# Patient Record
Sex: Male | Born: 1990 | Hispanic: No | Marital: Married | Smoking: Never smoker
Health system: Southern US, Community
[De-identification: ages and names within clinical notes are randomized; demographics above are authoritative.]

## PROBLEM LIST (undated history)

## (undated) DIAGNOSIS — K602 Anal fissure, unspecified: Secondary | ICD-10-CM

## (undated) DIAGNOSIS — A048 Other specified bacterial intestinal infections: Secondary | ICD-10-CM

## (undated) HISTORY — DX: Other specified bacterial intestinal infections: A04.8

## (undated) HISTORY — DX: Anal fissure, unspecified: K60.2

---

## 2019-12-21 HISTORY — PX: LACERATION REPAIR: SHX5168

## 2020-12-02 ENCOUNTER — Other Ambulatory Visit: Payer: Self-pay

## 2020-12-02 ENCOUNTER — Encounter (HOSPITAL_COMMUNITY): Payer: Self-pay

## 2020-12-02 ENCOUNTER — Ambulatory Visit (HOSPITAL_COMMUNITY)
Admission: EM | Admit: 2020-12-02 | Discharge: 2020-12-02 | Disposition: A | Discharge status: Home / Self Care | Payer: Medicaid Other | Attending: Family Medicine | Admitting: Family Medicine

## 2020-12-02 DIAGNOSIS — R52 Pain, unspecified: Secondary | ICD-10-CM | POA: Insufficient documentation

## 2020-12-02 DIAGNOSIS — R509 Fever, unspecified: Secondary | ICD-10-CM | POA: Diagnosis not present

## 2020-12-02 DIAGNOSIS — Z20822 Contact with and (suspected) exposure to covid-19: Secondary | ICD-10-CM | POA: Diagnosis not present

## 2020-12-02 DIAGNOSIS — J111 Influenza due to unidentified influenza virus with other respiratory manifestations: Secondary | ICD-10-CM | POA: Diagnosis not present

## 2020-12-02 LAB — RESP PANEL BY RT-PCR (FLU A&B, COVID) ARPGX2
Influenza A by PCR: NEGATIVE
Influenza B by PCR: NEGATIVE
SARS Coronavirus 2 by RT PCR: NEGATIVE

## 2020-12-02 NOTE — ED Provider Notes (Signed)
[place]    CSN: [id number] Arrival date & time: 12/02/20  1029      History   Chief Complaint Chief Complaint  Patient presents with   Back Pain   Sore Throat   Generalized Body Aches   Nausea    HPI Tyre Beaver is a 29 y.o. male.   Here today with 2 day history of sore throat, generalized body aches, chills, sweats, fever, and mild abdominal pain. Denies cough, CP, SOB, congestion, N/V/D. So far taking tylenol with temporary relief of sxs. Denies known medical problems, sick contacts, recent travel. UTD on vaccines.      History reviewed. No pertinent past medical history.  There are no problems to display for this patient.   History reviewed. No pertinent surgical history.     Home Medications    Prior to Admission medications   Not on File    Family History History reviewed. No pertinent family history.  Social History Social History   Tobacco Use   Smoking status: Never Smoker   Smokeless tobacco: Never Used  Scientific laboratory technician Use: Never used  Substance Use Topics   Alcohol use: Never   Drug use: Never     Allergies   Patient has no known allergies.   Review of Systems Review of Systems PER HPI   Physical Exam Triage Vital Signs ED Triage Vitals  Enc Vitals Group     BP 12/02/20 1150 119/78     Pulse Rate 12/02/20 1150 92     Resp 12/02/20 1150 18     Temp 12/02/20 1150 98.5 F (36.9 C)     Temp Source 12/02/20 1150 Oral     SpO2 12/02/20 1150 97 %     Weight --      Height --      Head Circumference --      Peak Flow --      Pain Score 12/02/20 1144 6     Pain Loc --      Pain Edu? --      Excl. in [place]? --    No data found.  Updated Vital Signs BP 119/78 (BP Location: Left Arm)    Pulse 92    Temp 98.5 F (36.9 C) (Oral)    Resp 18    SpO2 97%   Visual Acuity Right Eye Distance:   Left Eye Distance:   Bilateral Distance:    Right Eye Near:   Left Eye Near:    Bilateral  Near:     Physical Exam Vitals and nursing note reviewed.  Constitutional:      Appearance: Normal appearance.  HENT:     Head: Atraumatic.     Right Ear: Tympanic membrane normal.     Left Ear: Tympanic membrane normal.     Nose: Nose normal.     Mouth/Throat:     Mouth: Mucous membranes are moist.     Pharynx: Oropharynx is clear. Posterior oropharyngeal erythema present. No oropharyngeal exudate.  Eyes:     Extraocular Movements: Extraocular movements intact.     Conjunctiva/sclera: Conjunctivae normal.  Cardiovascular:     Rate and Rhythm: Normal rate and regular rhythm.  Pulmonary:     Effort: Pulmonary effort is normal.     Breath sounds: Normal breath sounds.  Abdominal:     General: Bowel sounds are normal. There is no distension.     Palpations: Abdomen is soft.     Tenderness: There  is no abdominal tenderness. There is no right CVA tenderness, left CVA tenderness or guarding.  Musculoskeletal:        General: Normal range of motion.     Cervical back: Normal range of motion and neck supple.  Skin:    General: Skin is warm and dry.  Neurological:     General: No focal deficit present.     Mental Status: He is oriented to person, place, and time.  Psychiatric:        Mood and Affect: Mood normal.        Thought Content: Thought content normal.        Judgment: Judgment normal.      UC Treatments / Results  Labs (all labs ordered are listed, but only abnormal results are displayed) Labs Reviewed  RESP PANEL BY RT-PCR (FLU A&B, COVID) [id number]    EKG   Radiology No results found.  Procedures Procedures (including critical care time)  Medications Ordered in UC Medications - No data to display  Initial Impression / Assessment and Plan / UC Course  I have reviewed the triage vital signs and the nursing notes.  Pertinent labs & imaging results that were available during my care of the patient were reviewed by me and considered in my medical decision  making (see chart for details).     Resp panel pending, vitals and exam reassuring and per pt sxs improved today from yesterday. Isolate, work note given, supportive OTC medications and home care reviewed. Return if sxs worsening.   Final Clinical Impressions(s) / UC Diagnoses   Final diagnoses:  Influenza-like illness  Generalized body aches  Fever, unspecified   Discharge Instructions   None    ED Prescriptions    None     PDMP not reviewed this encounter.   Volney American, [place] 12/02/20 1624

## 2020-12-02 NOTE — ED Triage Notes (Signed)
Pt presents with weakness, lower back pain, nausea, sore throat, generalized bodyaches x 2 days. Pt states he had a fever last night along with mid abdominal pain. Pt states he has taken tylenol and ibuprofen. Pt states he did not take medicine this morning. Pt denies SO and chest pain. Pt c/o left arm pain, he states he had arm surgery 3-4 month ago.

## 2021-03-16 ENCOUNTER — Telehealth: Payer: Self-pay | Admitting: Pediatric Intensive Care

## 2021-03-16 ENCOUNTER — Ambulatory Visit (INDEPENDENT_AMBULATORY_CARE_PROVIDER_SITE_OTHER): Payer: Medicaid Other | Admitting: Internal Medicine

## 2021-03-16 ENCOUNTER — Encounter: Payer: Self-pay | Admitting: Internal Medicine

## 2021-03-16 VITALS — BP 120/78 | HR 66 | Temp 98.5°F | Ht 64.96 in | Wt 155.8 lb

## 2021-03-16 DIAGNOSIS — F411 Generalized anxiety disorder: Secondary | ICD-10-CM | POA: Diagnosis present

## 2021-03-16 DIAGNOSIS — R1013 Epigastric pain: Secondary | ICD-10-CM | POA: Diagnosis not present

## 2021-03-16 MED ORDER — FLUOXETINE HCL 20 MG PO CAPS: 20.0000 mg | ORAL_CAPSULE | Freq: Every day | ORAL | 2 refills | Status: DC

## 2021-03-16 NOTE — Progress Notes (Signed)
   CC: Stomach Gas and Palpitations  HPI:  [name] is a 30 y.o. M/F, with a PMH noted below, who presents to the clinic stomach gas and palpitations. To see the management of their acute and chronic conditions, please see the A&P note under the Encounters tab.   No past medical history on file.   PMH:  H. Pylori Infection  PSHx:  Laceration repair of L arm 2021  Medication:  Gas x 180 mg QD Omeprazole 20 mg BID  FH:  Mother: HTN Cancers: No Stomach Problems:   SHx:  Lives in [place] with wife and children Work: Press photographer Tobacco: None ETOH: None Drugs: None   Review of Systems:   Review of Systems  Constitutional: Negative for chills, fever, malaise/fatigue and weight loss.  Cardiovascular: Positive for palpitations. Negative for chest pain, orthopnea, claudication and leg swelling.  Gastrointestinal: Positive for heartburn. Negative for abdominal pain, blood in stool, constipation, diarrhea, melena, nausea and vomiting.  Musculoskeletal: Negative for back pain, myalgias and neck pain.  Neurological: Negative for dizziness, tingling, tremors and headaches.     Physical Exam:  Vitals:   03/16/21 1418  BP: 120/78  Pulse: 66  Temp: 98.5 F (36.9 C)  TempSrc: Oral  SpO2: 99%  Weight: 155 lb 12.8 oz (70.7 kg)   Physical Exam Constitutional:      General: He is not in acute distress.    Appearance: He is not ill-appearing, toxic-appearing or diaphoretic.  HENT:     Head: Normocephalic and atraumatic.  Cardiovascular:     Rate and Rhythm: Normal rate and regular rhythm.     Pulses: Normal pulses.     Heart sounds: Normal heart sounds. No murmur heard. No gallop.   Pulmonary:     Effort: Pulmonary effort is normal. No respiratory distress.     Breath sounds: Normal breath sounds. No stridor. No wheezing, rhonchi or rales.  Abdominal:     General: Abdomen is flat. Bowel sounds are normal.     Palpations: Abdomen is soft.      Tenderness: There is no abdominal tenderness. There is no guarding.  Skin:    General: Skin is warm.  Neurological:     Mental Status: He is alert and oriented to person, place, and time.      Assessment & Plan:   See Encounters Tab for problem based charting.  Patient discussed with Dr. Philipp Ovens

## 2021-03-16 NOTE — Patient Instructions (Addendum)
To Ryan Hayes,   It was a pleasure meeting you today. Today we talked about your stomach pain and anxiety. For your stomach pain we will check for H. Pylori. Please do not take omeprazole until you complete the test. Please fill your cup on 03/27/2021 and bring it to our clinic. For your anxiety, I will start you on a medication called Prozac. Please take one pill daily, and it may take 4-6 weeks to work. We will see you back in 4 weeks to see how you are doing. Have a good day.  Maudie Mercury, MD    ? ??    ?   .          ~?. ?    ?~~ ??  ? ? ~?.   ? ~ ?  ~ ~ ?    ~?.      ? 03/27/2021  ~?     ~??~  ??. ? ?      ??   ~  ? ~.   ?~   ~?  ~  4  6   ~   ~. 4  ?   ? ?  ?   ??.  ?  ?. ~ ? ?

## 2021-03-16 NOTE — Telephone Encounter (Signed)
Call to client to establish care. Client [id number]. Client states that he is feeling like he is sick from allergies but still wants to go to appointment today. He wants to return home after his appointment. CN will call transportation services to change return ride. [name] Rn BSN CNP [phone]

## 2021-03-16 NOTE — Telephone Encounter (Signed)
Call to transportation services to confirm client return ride to home. [name] Rn BSN CNP [phone]

## 2021-03-17 ENCOUNTER — Encounter: Payer: Self-pay | Admitting: Internal Medicine

## 2021-03-17 DIAGNOSIS — F411 Generalized anxiety disorder: Secondary | ICD-10-CM | POA: Insufficient documentation

## 2021-03-17 DIAGNOSIS — R1013 Epigastric pain: Secondary | ICD-10-CM | POA: Insufficient documentation

## 2021-03-17 LAB — BMP8+ANION GAP
Anion Gap: 18 mmol/L (ref 10.0–18.0)
BUN/Creatinine Ratio: 17 (ref 9–20)
BUN: 13 mg/dL (ref 6–20)
CO2: 21 mmol/L (ref 20–29)
Calcium: 9.9 mg/dL (ref 8.7–10.2)
Chloride: 98 mmol/L (ref 96–106)
Creatinine, Ser: 0.77 mg/dL (ref 0.76–1.27)
Glucose: 87 mg/dL (ref 65–99)
Potassium: 4.3 mmol/L (ref 3.5–5.2)
Sodium: 137 mmol/L (ref 134–144)
eGFR: 124 mL/min/{1.73_m2} (ref >59)

## 2021-03-17 LAB — CBC
Hematocrit: 44.3 % (ref 37.5–51.0)
Hemoglobin: 14.2 g/dL (ref 13.0–17.7)
MCH: 25.7 pg — ABNORMAL LOW (ref 26.6–33.0)
MCHC: 32.1 g/dL (ref 31.5–35.7)
MCV: 80 fL (ref 79–97)
Platelets: 277 10*3/uL (ref 150–450)
RBC: 5.53 x10E6/uL (ref 4.14–5.80)
RDW: 12.5 % (ref 11.6–15.4)
WBC: 5.8 10*3/uL (ref 3.4–10.8)

## 2021-03-17 LAB — TSH: TSH: 0.992 u[IU]/mL (ref 0.450–4.500)

## 2021-03-17 NOTE — Assessment & Plan Note (Signed)
Patient presents to the office with complaints of dyspepsia. He states that he has had "stomach gas problems" in the past. He states that he had a H. Pylori infection as a child. He remembers taking medications for it, but is unsure if he finished the antibiotics or if the infection was cleared.   He has been taking omeprazole with resolution of his symptoms. Will screen today to ensure that his H. Pylori infection cleared. Patient stopped omeprazole 4 days ago. Discussed halting all PPIs for a total of 2 weeks as they can interfere with testing. Patient voiced understanding. - Stool Ag test for H. Pylori - Hold PPIs for 2 weeks total - If + continue with treatment, if negative continue PPI
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
## 2021-03-23 NOTE — Progress Notes (Signed)
Internal Medicine Clinic Attending ? ?Case discussed with Dr. Winters  At the time of the visit.  We reviewed the resident?s history and exam and pertinent patient test results.  I agree with the assessment, diagnosis, and plan of care documented in the resident?s note.  ?

## 2021-03-27 ENCOUNTER — Other Ambulatory Visit: Payer: Self-pay

## 2021-03-27 ENCOUNTER — Other Ambulatory Visit: Payer: Medicaid Other

## 2021-03-29 LAB — H. PYLORI ANTIGEN, STOOL: H pylori Ag, Stl: NEGATIVE

## 2021-04-23 ENCOUNTER — Telehealth: Payer: Self-pay

## 2021-04-23 NOTE — Telephone Encounter (Signed)
Error

## 2021-04-24 ENCOUNTER — Ambulatory Visit
Admission: RE | Admit: 2021-04-24 | Discharge: 2021-04-24 | Disposition: A | Discharge status: Home / Self Care | Payer: Medicaid Other | Source: Ambulatory Visit | Attending: Obstetrics and Gynecology | Admitting: Obstetrics and Gynecology

## 2021-04-24 ENCOUNTER — Other Ambulatory Visit: Payer: Self-pay

## 2021-04-24 ENCOUNTER — Other Ambulatory Visit: Payer: Self-pay | Admitting: Obstetrics and Gynecology

## 2021-04-24 DIAGNOSIS — Z111 Encounter for screening for respiratory tuberculosis: Secondary | ICD-10-CM

## 2021-04-30 ENCOUNTER — Encounter: Payer: Medicaid Other | Admitting: Internal Medicine

## 2021-05-06 ENCOUNTER — Encounter: Payer: Medicaid Other | Admitting: Internal Medicine

## 2021-06-09 ENCOUNTER — Telehealth: Payer: Self-pay

## 2021-06-09 ENCOUNTER — Encounter: Payer: Medicaid Other | Admitting: Internal Medicine

## 2021-06-09 NOTE — Telephone Encounter (Signed)
PT is requesting a call back ... he stated that the pain in his back  he has been having is getting worse pt stated that the pain is making it hard for him to sit .Marland KitchenMarland Kitchen

## 2021-06-09 NOTE — Telephone Encounter (Signed)
RTC, patient asking if he can be seen today instead of tomorrow.  Pt was informed there are no openings this afternoon with any physician and he already has a 0915 appt tomorrow morning w/ his PCP.    He states his "back pain is very bad and it hurts to sit, like when you are constipated, but I am not constipated".  RN instructed patient if his pain is severe, he should go to ED for evaluation.  He states "no, it can wait until tomorrow".  Pt now asking about lab results from March, he was told to discuss this with MD at tomorrow's visit. SChaplin, RN,BSN

## 2021-06-10 ENCOUNTER — Telehealth: Payer: Self-pay | Admitting: *Deleted

## 2021-06-10 ENCOUNTER — Ambulatory Visit: Payer: Medicaid Other | Admitting: Internal Medicine

## 2021-06-10 ENCOUNTER — Encounter: Payer: Self-pay | Admitting: Internal Medicine

## 2021-06-10 DIAGNOSIS — A159 Respiratory tuberculosis unspecified: Secondary | ICD-10-CM | POA: Diagnosis not present

## 2021-06-10 DIAGNOSIS — K5900 Constipation, unspecified: Secondary | ICD-10-CM | POA: Diagnosis not present

## 2021-06-10 MED ORDER — PSYLLIUM 28 % PO PACK: 1.0000 | PACK | Freq: Two times a day (BID) | ORAL | 1 refills | Status: DC

## 2021-06-10 MED ORDER — SITZ BATH MISC: 1.0000 [IU] | Freq: Two times a day (BID) | 0 refills | Status: DC

## 2021-06-10 MED ORDER — LIDOCAINE HCL 2 % EX GEL: 1.0000 "application " | CUTANEOUS | 0 refills | Status: DC | PRN

## 2021-06-10 NOTE — Assessment & Plan Note (Signed)
Patient noted, at the end of our visit, that he is positive for tuberculosis and is currently being treated at the [facility]. He is currently taking INH and vit B6.  - Continue to take INH and Vit B6 - FU at [facility]

## 2021-06-10 NOTE — Telephone Encounter (Signed)
Adonis Huguenin from [facility] called requesting clarification on Rx for lidocaine jelly. States 2% comes with an applicator tip and has to be specially ordered. Please advise.

## 2021-06-10 NOTE — Assessment & Plan Note (Signed)
Patient presents to the clinic today with concerns for pain on defecation for the past two weeks. He states that his pain feels like a burning sensation. He denies constipation, but does endorse straining. He additionally endorses pain when he sits directly on a hard surface.  He denies blood on or intermixed with his stool.  A/P: Patient presents to the clinic for two weeks of dyschezia. On physical examination, no skin tags, or external hemorrhoids are present. On insertion, pain to palpation at the posterior portion of the sphincter consistent with an anal fissure. Discussed findings with patient.  - Lidocaine Jelly 2% daily PRN - Sitz bath BID for 15 minutes - Metamucil BID - Donut pillow - Reevaluate in one month

## 2021-06-10 NOTE — Progress Notes (Signed)
   CC: Pain on Defecation  HPI:  [name] is a 30 y.o. person, with a PMH noted below, who presents to the clinic to be evaluated for two weeks of pain on defecation. To see the management of their acute and chronic conditions, please see the A&P note under the Encounters tab.   Past Medical History:  Diagnosis Date   H. pylori infection    Childhood   Review of Systems:   Review of Systems  Constitutional:  Negative for chills, diaphoresis, fever, malaise/fatigue and weight loss.  Respiratory:  Negative for sputum production, shortness of breath and wheezing.   Cardiovascular:  Negative for chest pain and palpitations.  Gastrointestinal:  Negative for abdominal pain, constipation, diarrhea, nausea and vomiting.    Physical Exam:  Vitals:   06/10/21 0951  BP: 127/79  Pulse: 91  Temp: 98.6 F (37 C)  TempSrc: Oral  SpO2: 97%  Weight: 150 lb 8 oz (68.3 kg)   Physical Exam Constitutional:      General: He is not in acute distress.    Appearance: Normal appearance. He is normal weight. He is not ill-appearing, toxic-appearing or diaphoretic.  Cardiovascular:     Rate and Rhythm: Normal rate and regular rhythm.  Pulmonary:     Effort: Pulmonary effort is normal. No respiratory distress.     Breath sounds: Normal breath sounds.  Genitourinary:    Comments: No external hemorrhoids or visible tears present. Tenderness to palpation at the posterior midline on digital insertion Neurological:     Mental Status: He is alert.  Psychiatric:        Mood and Affect: Mood normal.        Behavior: Behavior normal.     Assessment & Plan:   See Encounters Tab for problem based charting.  Patient discussed with Dr. Jimmye Norman

## 2021-06-10 NOTE — Patient Instructions (Signed)
Ryan Hayes,   It was a pleasure seeing you! Today we discussed your pain when using the restroom. You have what is called an anal fissure, which is a tear of the anus. It usually happens to patients with constipation.   We will give you a numbing jelly

## 2021-06-10 NOTE — Telephone Encounter (Signed)
Agree with assessment and plan, thank you.

## 2021-06-11 ENCOUNTER — Encounter: Payer: Self-pay | Admitting: Internal Medicine

## 2021-06-11 MED ORDER — FLUOXETINE HCL 20 MG PO CAPS
20.0000 mg | ORAL_CAPSULE | Freq: Every day | ORAL | 2 refills | Status: DC
Start: 2021-06-11 — End: 2022-03-05

## 2021-06-23 ENCOUNTER — Encounter: Payer: Self-pay | Admitting: *Deleted

## 2021-07-22 ENCOUNTER — Other Ambulatory Visit: Payer: Self-pay

## 2021-07-22 ENCOUNTER — Ambulatory Visit (HOSPITAL_COMMUNITY)
Admission: EM | Admit: 2021-07-22 | Discharge: 2021-07-22 | Disposition: A | Discharge status: Home / Self Care | Payer: Medicaid Other | Attending: Student | Admitting: Student

## 2021-07-22 ENCOUNTER — Encounter (HOSPITAL_COMMUNITY): Payer: Self-pay | Admitting: *Deleted

## 2021-07-22 DIAGNOSIS — S39012A Strain of muscle, fascia and tendon of lower back, initial encounter: Secondary | ICD-10-CM | POA: Diagnosis not present

## 2021-07-22 MED ORDER — TIZANIDINE HCL 2 MG PO TABS: 2.0000 mg | ORAL_TABLET | Freq: Four times a day (QID) | ORAL | 0 refills | Status: DC | PRN

## 2021-07-22 MED ORDER — CYCLOBENZAPRINE HCL 10 MG PO TABS: 10.0000 mg | ORAL_TABLET | Freq: Two times a day (BID) | ORAL | 0 refills | Status: DC | PRN

## 2021-07-22 MED ORDER — METHYLPREDNISOLONE SODIUM SUCC 125 MG IJ SOLR
60.0000 mg | Freq: Once | INTRAMUSCULAR | Status: DC
Start: 2021-07-22 — End: 2021-07-22

## 2021-07-22 MED ORDER — METHYLPREDNISOLONE SODIUM SUCC 125 MG IJ SOLR
INTRAMUSCULAR | Status: AC
  Filled 2021-07-22: qty 2
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
## 2021-07-22 NOTE — ED Triage Notes (Signed)
PT reports back pain for one week.

## 2021-07-22 NOTE — Discharge Instructions (Addendum)
-  Start the muscle relaxer-Zanaflex (tizanidine), up to 3 times daily for muscle spasms and pain.  This can make you drowsy, so take at bedtime or when you do not need to drive or operate machinery. -You can take Tylenol 1000 mg 3 times daily, and ibuprofen 800 mg 3 times daily with food.  You can take these together, or alternate every 3-4 hours. -Heating pad, gentle range of motion exercises -If symptoms persist, follow-up with your PCP to send a referral to orthopedist . -Alternatively, if symptoms persist in 4-5 days, schedule an appointment with an orthopedist yourself. I recommend [facility] at [address] [place], [place] [address]. You can schedule an appointment by calling [phone]) or online ([email or web address]), but they also have a walk-in clinic M-F 8a-8p and Sat 10a-3p.

## 2021-08-15 ENCOUNTER — Encounter: Payer: Self-pay | Admitting: Physical Therapy

## 2021-08-15 ENCOUNTER — Other Ambulatory Visit: Payer: Self-pay

## 2021-08-15 ENCOUNTER — Ambulatory Visit: Payer: Managed Care, Other (non HMO) | Attending: Sports Medicine | Admitting: Physical Therapy

## 2021-08-15 DIAGNOSIS — G8929 Other chronic pain: Secondary | ICD-10-CM | POA: Diagnosis present

## 2021-08-15 DIAGNOSIS — R293 Abnormal posture: Secondary | ICD-10-CM | POA: Diagnosis present

## 2021-08-15 DIAGNOSIS — M545 Low back pain, unspecified: Secondary | ICD-10-CM | POA: Diagnosis present
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
## 2021-08-21 ENCOUNTER — Other Ambulatory Visit: Payer: Self-pay

## 2021-08-21 ENCOUNTER — Ambulatory Visit: Payer: Managed Care, Other (non HMO) | Attending: Sports Medicine | Admitting: Physical Therapy

## 2021-08-21 ENCOUNTER — Encounter: Payer: Self-pay | Admitting: Physical Therapy

## 2021-08-21 DIAGNOSIS — R293 Abnormal posture: Secondary | ICD-10-CM

## 2021-08-21 DIAGNOSIS — M545 Low back pain, unspecified: Secondary | ICD-10-CM | POA: Insufficient documentation

## 2021-08-21 DIAGNOSIS — G8929 Other chronic pain: Secondary | ICD-10-CM | POA: Diagnosis present
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
## 2021-08-29 ENCOUNTER — Ambulatory Visit: Payer: Medicaid Other | Admitting: Physical Therapy

## 2021-08-29 ENCOUNTER — Other Ambulatory Visit: Payer: Self-pay

## 2021-08-29 ENCOUNTER — Encounter: Payer: Self-pay | Admitting: Physical Therapy

## 2021-08-29 ENCOUNTER — Ambulatory Visit: Payer: Managed Care, Other (non HMO) | Admitting: Physical Therapy

## 2021-08-29 DIAGNOSIS — R293 Abnormal posture: Secondary | ICD-10-CM

## 2021-08-29 DIAGNOSIS — G8929 Other chronic pain: Secondary | ICD-10-CM

## 2021-08-29 DIAGNOSIS — M545 Low back pain, unspecified: Secondary | ICD-10-CM | POA: Diagnosis not present
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
## 2021-08-29 NOTE — Patient Instructions (Signed)

## 2021-09-04 ENCOUNTER — Encounter: Payer: Self-pay | Admitting: Physical Therapy

## 2021-09-04 ENCOUNTER — Ambulatory Visit: Payer: Managed Care, Other (non HMO) | Admitting: Physical Therapy

## 2021-09-04 ENCOUNTER — Other Ambulatory Visit: Payer: Self-pay

## 2021-09-04 DIAGNOSIS — M545 Low back pain, unspecified: Secondary | ICD-10-CM | POA: Diagnosis not present

## 2021-09-04 DIAGNOSIS — R293 Abnormal posture: Secondary | ICD-10-CM
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
## 2021-09-07 ENCOUNTER — Other Ambulatory Visit: Payer: Self-pay | Admitting: Internal Medicine

## 2021-09-08 MED ORDER — PSYLLIUM 28 % PO PACK: 1.0000 | PACK | Freq: Two times a day (BID) | ORAL | 1 refills | Status: DC

## 2021-09-08 NOTE — Addendum Note (Signed)
Addended by: Maudie Mercury C on: 09/08/2021 02:04 PM   Modules accepted: Orders

## 2021-09-09 ENCOUNTER — Other Ambulatory Visit: Payer: Self-pay

## 2021-09-09 MED ORDER — LIDOCAINE HCL 2 % EX GEL
1.0000 "application " | CUTANEOUS | 0 refills | Status: DC | PRN
  Filled 2021-09-09: qty 30, fill #0

## 2021-09-10 ENCOUNTER — Other Ambulatory Visit: Payer: Self-pay

## 2021-09-11 ENCOUNTER — Encounter: Payer: Medicaid Other | Admitting: Physical Therapy

## 2021-09-26 ENCOUNTER — Other Ambulatory Visit: Payer: Self-pay

## 2021-09-26 ENCOUNTER — Encounter: Payer: Self-pay | Admitting: Physical Therapy

## 2021-09-26 ENCOUNTER — Ambulatory Visit: Payer: Managed Care, Other (non HMO) | Attending: Sports Medicine | Admitting: Physical Therapy

## 2021-09-26 DIAGNOSIS — M545 Low back pain, unspecified: Secondary | ICD-10-CM | POA: Diagnosis not present

## 2021-09-26 DIAGNOSIS — G8929 Other chronic pain: Secondary | ICD-10-CM | POA: Diagnosis present

## 2021-09-26 DIAGNOSIS — R293 Abnormal posture: Secondary | ICD-10-CM | POA: Insufficient documentation
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
## 2021-10-03 ENCOUNTER — Other Ambulatory Visit: Payer: Self-pay

## 2021-10-03 ENCOUNTER — Ambulatory Visit: Payer: Managed Care, Other (non HMO)

## 2021-10-03 DIAGNOSIS — G8929 Other chronic pain: Secondary | ICD-10-CM

## 2021-10-03 DIAGNOSIS — M545 Low back pain, unspecified: Secondary | ICD-10-CM | POA: Diagnosis not present

## 2021-10-03 DIAGNOSIS — R293 Abnormal posture: Secondary | ICD-10-CM
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
## 2021-10-05 ENCOUNTER — Ambulatory Visit (INDEPENDENT_AMBULATORY_CARE_PROVIDER_SITE_OTHER): Payer: Managed Care, Other (non HMO) | Admitting: Student

## 2021-10-05 ENCOUNTER — Other Ambulatory Visit: Payer: Self-pay

## 2021-10-05 ENCOUNTER — Encounter: Payer: Self-pay | Admitting: Student

## 2021-10-05 VITALS — BP 117/66 | HR 86 | Temp 98.0°F | Ht 67.0 in | Wt 164.5 lb

## 2021-10-05 DIAGNOSIS — Z114 Encounter for screening for human immunodeficiency virus [HIV]: Secondary | ICD-10-CM

## 2021-10-05 DIAGNOSIS — S39012D Strain of muscle, fascia and tendon of lower back, subsequent encounter: Secondary | ICD-10-CM | POA: Diagnosis not present

## 2021-10-05 DIAGNOSIS — F411 Generalized anxiety disorder: Secondary | ICD-10-CM | POA: Diagnosis not present

## 2021-10-05 DIAGNOSIS — K5900 Constipation, unspecified: Secondary | ICD-10-CM | POA: Diagnosis not present

## 2021-10-05 DIAGNOSIS — Z1159 Encounter for screening for other viral diseases: Secondary | ICD-10-CM | POA: Diagnosis not present

## 2021-10-05 DIAGNOSIS — S39012A Strain of muscle, fascia and tendon of lower back, initial encounter: Secondary | ICD-10-CM | POA: Insufficient documentation

## 2021-10-05 MED ORDER — "NITROGLYCERIN NICU 2% OINTMENT ": 1.0000 "application " | TOPICAL_OINTMENT | Freq: Two times a day (BID) | TRANSDERMAL | 1 refills | Status: DC

## 2021-10-05 MED ORDER — LIDOCAINE HCL 2 % EX GEL: 1.0000 "application " | CUTANEOUS | 2 refills | Status: DC | PRN

## 2021-10-05 MED ORDER — SENNA 8.6 MG PO TABS: 1.0000 | ORAL_TABLET | Freq: Every day | ORAL | 1 refills | Status: DC | PRN

## 2021-10-05 NOTE — Assessment & Plan Note (Signed)
Patient was evaluated at the urgent care on August 3rd for cute onset of low back pain.  Patient reports that he started having low back pain after using his sitz bath and moving some furniture. Urgent care, he was diagnosed with lumbar strain and prescribed Flexeril and Mobic. Patient states he is currently getting physical therapy. His symptoms have continued to improved with PT and home exercises.  He denies any weakness, fever, bowel or bladder incontinence. -- Continue with PT -- OTC ibuprofen/Tylenol as needed

## 2021-10-05 NOTE — Patient Instructions (Addendum)
Thank you, Mr.[name] Tyge Somers for allowing [place] to provide your care today. Today we discussed your anal fissure, vaccination and back pain.  Continue physical therapy for back pain and take over-the-counter ibuprofen/Tylenol as needed.  For your rectal pain, this is likely rectal spasm due to your internal hemorrhoids and anal fissure.  Continue the sitz bath's and lidocaine jelly for relief.  Eat a lot of fiber try to follow: Sit in a tub of warm water for about 15-20 minutes Eat a high fiber diet Do not delay going to the bathroom on feeling the urge Try elevating the feet a bit with a step stool while sitting on the toilet seat  I have ordered the following labs for you:   Lab Orders         HIV antibody (with reflex)         Hepatitis C antibody      I will call if any are abnormal. All of your labs can be accessed through "My Chart".   I have ordered the following medication/changed the following medications:  Senna 8.6 mg once daily as needed for constipation  My Chart Access: [email or web address]  Please follow-up in 1 month  Please make sure to arrive 15 minutes prior to your next appointment. If you arrive late, you may be asked to reschedule.    We look forward to seeing you next time. Please call our clinic at [phone] if you have any questions or concerns. The best time to call is Monday-Friday from 9am-4pm, but there is someone available 24/7. If after hours or the weekend, call the main hospital number and ask for the Internal Medicine Resident On-Call. If you need medication refills, please notify your pharmacy one week in advance and they will send [place] a request.   Thank you for letting us take part in your care. Wishing you the best!  Lacinda Axon, MD 10/05/2021, 9:54 AM IM Resident, PGY-2 Oswaldo Milian 41:10

## 2021-10-05 NOTE — Progress Notes (Signed)
Nitroglycerin 2% ointment rx faxed to [facility] on [address],

## 2021-10-05 NOTE — Assessment & Plan Note (Signed)
Patient reports his anxiety is much better. He is tolerating the Prozac well. -- Continue Prozac 20 mg daily -- Repeat GAD-7 at next office visit

## 2021-10-05 NOTE — Assessment & Plan Note (Addendum)
Patient here for follow-up on his abdominal pain/constipation after initial evaluation 4 months ago. Patient states he still having symptoms. He describes his symptoms as pain when he sits on the toilet. The pain is occasionally worse with defecation and he endorses mild constipation but usually has 1 bowel movement per day.  Denies any anal itching, anal bleeding or streaks of blood in stool.  Patient reports he only did the sitz bath once because he experienced some discomfort in his groin and the next day. He also used the lidocaine jelly for about a month and stopped. On further questioning, patient was squeezing the lidocaine jelly directly into his anus. On rectal exam, there were no signs of external hemorrhoids, mass or anal tags and no new fissures.  Patient has no history of Crohn's disease.  Symptoms likely due to anal spasms in the setting of internal hemorrhoids or anal fissure.  We will continue to try topical agents and behavioral modification to monitor closely for improvement in symptoms.  Plan: -- Educated patient on how to use topical agents with anal fissure --Start nitroglycerin 2% ointment every 12 hours --Start senna 8.6 mg, 1 tablet daily as needed for constipation --Continue lidocaine jelly 2% daily as needed for pain --Increase fiber and fluid intake --Can consider endoscopy in the future if medical therapy fails --1-2 months follow-up

## 2021-10-05 NOTE — Progress Notes (Signed)
   CC: Constipation  HPI:  Ryan Hayes is a 30 y.o. male with PMH as below who presents to the clinic for follow-up on constipation/anal fissure. Please see problem based charting for evaluation, assessment and plan.  Past Medical History:  Diagnosis Date   H. pylori infection    Childhood    Review of Systems:  Constitutional: Negative for fever or fatigue MSK: Positive for back pain Abdomen: Positive for constipation. Negative for abdominal pain, vomiting, nausea, bloody stools or diarrhea GU: Positive for anal pain. Negative for bowel or bladder incontinence Neuro: Negative for headache, dizziness or weakness  Physical Exam: General: Pleasant, well-appearing [place] refugee. No acute distress. Cardiac: RRR. No murmurs, rubs or gallops.  Respiratory: Lungs CTAB. No wheezing or crackles. Abdominal: Soft, symmetric, non distended and non tender. No organomegaly. Normal BS. GU: Rectal exam: No visualized external hemorrhoids or anal tags. Small nonbleeding tear in the posterior aspect of the anal mucosa. No tenderness to palpation around the anal os. Skin: Warm, dry and intact without rashes or lesions Neuro: A&O x 3. No focal deficits  Vitals:   10/05/21 0853  BP: 117/66  Pulse: 86  Temp: 98 F (36.7 C)  TempSrc: Oral  SpO2: 98%  Weight: 164 lb 8 oz (74.6 kg)  Height: 5\' 7"  (1.702 m)     Assessment & Plan:   See Encounters Tab for problem based charting.  Patient discussed with Dr.  Louann Liv, MD, MPH

## 2021-10-06 LAB — HEPATITIS C ANTIBODY: Hep C Virus Ab: <0.1 s/co ratio (ref 0.0–0.9)

## 2021-10-06 LAB — HIV ANTIBODY (ROUTINE TESTING W REFLEX): HIV Screen 4th Generation wRfx: NONREACTIVE

## 2021-10-06 NOTE — Progress Notes (Signed)
Internal Medicine Clinic Attending  Case discussed with Dr. Amponsah  At the time of the visit.  We reviewed the resident's history and exam and pertinent patient test results.  I agree with the assessment, diagnosis, and plan of care documented in the resident's note.  

## 2021-10-07 ENCOUNTER — Telehealth: Payer: Self-pay

## 2021-10-07 NOTE — Telephone Encounter (Signed)
Pt is requesting a call back he saw Dr Coy Saunas 10/17.. he stated he missed a call yesterday from there clinic but he is not sure if it was for his results to his lab  . Also she stated that he was to get [facility] oint[facility] but when he went to collect it they stated there was no medication to be collected

## 2021-10-07 NOTE — Telephone Encounter (Signed)
Thank you. Just spoke with him.

## 2021-10-07 NOTE — Telephone Encounter (Signed)
Dr. Coy Saunas, Pt is calling you back. Thanks, SChaplin, RN,BSN

## 2021-10-12 ENCOUNTER — Telehealth: Payer: Self-pay | Admitting: *Deleted

## 2021-10-12 NOTE — Telephone Encounter (Signed)
Thank you. I have spoken with patient.

## 2021-10-12 NOTE — Telephone Encounter (Signed)
Patient called in stating he applied the NTG ung to rectum for the first time last night. W/i one hour he developed severe h/a which remains this AM. Please advise.

## 2021-10-17 ENCOUNTER — Encounter: Payer: Self-pay | Admitting: Physical Therapy

## 2021-10-17 ENCOUNTER — Ambulatory Visit: Payer: Managed Care, Other (non HMO) | Admitting: Physical Therapy

## 2021-10-17 ENCOUNTER — Other Ambulatory Visit: Payer: Self-pay

## 2021-10-17 DIAGNOSIS — M545 Low back pain, unspecified: Secondary | ICD-10-CM | POA: Diagnosis not present

## 2021-10-17 DIAGNOSIS — R293 Abnormal posture: Secondary | ICD-10-CM

## 2021-10-17 DIAGNOSIS — G8929 Other chronic pain: Secondary | ICD-10-CM
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
## 2021-12-15 ENCOUNTER — Ambulatory Visit: Payer: Medicaid Other | Admitting: Internal Medicine

## 2021-12-15 ENCOUNTER — Encounter: Payer: Self-pay | Admitting: Internal Medicine

## 2021-12-15 VITALS — BP 130/65 | HR 89 | Temp 98.2°F | Ht 67.0 in | Wt 162.4 lb

## 2021-12-15 DIAGNOSIS — K5900 Constipation, unspecified: Secondary | ICD-10-CM

## 2021-12-15 DIAGNOSIS — K602 Anal fissure, unspecified: Secondary | ICD-10-CM

## 2021-12-15 NOTE — Progress Notes (Signed)
Internal Medicine Clinic Attending ? ?Case discussed with Dr. Winters  At the time of the visit.  We reviewed the resident?s history and exam and pertinent patient test results.  I agree with the assessment, diagnosis, and plan of care documented in the resident?s note.  ?

## 2021-12-15 NOTE — Patient Instructions (Addendum)
To [name],   It was a pleasure seeing you today. Today we discussed your anal fissure. I am going to refer your to a gastroenterologist (GI) doctor today to have a look at your fissure. In the meantime please continue to use your sitz bath , fiber supplement, and nitro paste. We additionally talked about your pants causing some irritation. Please try "straight fit" pants. For your wife looking to establish with a primary care physician, she is more than welcome to call our clinic at [phone].  Have a Happy New Year,  Maudie Mercury, MD

## 2021-12-15 NOTE — Assessment & Plan Note (Addendum)
Patient presents with continued concern for his anal fissure/dyschezia.  Patient has been using sitz bath, Metamucil smooth, Senokot, nitroglycerin ointment, lidocaine jelly, for his fissure with minimal improvement.  His symptoms have been ongoing since spring 2022. Patient has been consistent with his current medication regimen, and takes medications as directed. He states that he longer has constipation as long as he is taking his Metamucil.  No new fissures, anal tags, or hemorrhoids appreciated on today's examination, but continues to have small posterior tear. Given the prolonged course of his symptoms, will refer for endoscopy to rule out Crohn's disease.  If negative for Crohn's, will further discussed with the patient possible surgical interventions. - Refer to gastroenterology - Continue sitz bath, nitroglycerin 2% ointment twice daily, Metamucil, Senokot.

## 2021-12-15 NOTE — Progress Notes (Signed)
° °  CC: Dyschezia follow-up  HPI:  Ryan Hayes is a 30 y.o. person, with a PMH noted below, who presents to the clinic for follow-up on his dyschezia/anal fissure. To see the management of their acute and chronic conditions, please see the A&P note under the Encounters tab.   Past Medical History:  Diagnosis Date   H. pylori infection    Childhood   Review of Systems:   Review of Systems  Constitutional:  Negative for chills, diaphoresis, fever, malaise/fatigue and weight loss.  Cardiovascular:  Negative for chest pain and palpitations.  Gastrointestinal:  Negative for abdominal pain, blood in stool, constipation, diarrhea, melena, nausea and vomiting.  Psychiatric/Behavioral:  Negative for depression.     Physical Exam:  Vitals:   12/15/21 0940  BP: 130/65  Pulse: 89  Temp: 98.2 F (36.8 C)  TempSrc: Oral  SpO2: 98%  Weight: 162 lb 6.4 oz (73.7 kg)  Height: 5\' 7"  (1.702 m)   Physical Exam Constitutional:      Appearance: Normal appearance.  HENT:     Head: Normocephalic and atraumatic.  Cardiovascular:     Rate and Rhythm: Normal rate and regular rhythm.     Pulses: Normal pulses.     Heart sounds: Normal heart sounds.  Pulmonary:     Effort: Pulmonary effort is normal.     Breath sounds: Normal breath sounds.  Genitourinary:    Comments: Small, nonbleeding tear the posterior aspect of the anus, residual tissue paper noted around the os. Neurological:     Mental Status: He is alert and oriented to person, place, and time.  Psychiatric:        Mood and Affect: Mood normal.        Behavior: Behavior normal.     Assessment & Plan:   See Encounters Tab for problem based charting.  Patient discussed with Dr. Jimmye Norman

## 2022-01-11 ENCOUNTER — Encounter: Payer: Self-pay | Admitting: Physician Assistant

## 2022-01-11 ENCOUNTER — Ambulatory Visit: Payer: Medicaid Other | Admitting: Physician Assistant

## 2022-01-11 VITALS — BP 116/62 | HR 62 | Ht 67.0 in | Wt 168.0 lb

## 2022-01-11 DIAGNOSIS — K5909 Other constipation: Secondary | ICD-10-CM | POA: Diagnosis not present

## 2022-01-11 DIAGNOSIS — K602 Anal fissure, unspecified: Secondary | ICD-10-CM

## 2022-01-11 MED ORDER — AMBULATORY NON FORMULARY MEDICATION
1 refills | Status: DC
Start: 2022-01-11 — End: 2022-05-18

## 2022-01-11 MED ORDER — NA SULFATE-K SULFATE-MG SULF 17.5-3.13-1.6 GM/177ML PO SOLN
1.0000 | Freq: Once | ORAL | 0 refills | Status: AC
Start: 2022-01-11 — End: 2022-01-11
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
## 2022-01-11 NOTE — Patient Instructions (Addendum)
Continue daily fiber supplement.   We have sent a prescription for Diltiazem 2% gel to [facility] for you. Using your index finger, you should apply a small amount of medication inside the rectum up to your first knuckle/joint three times daily x 6-8 weeks.  [facility]'s information is below: Address: [address] [place], [place] [address]  [phone]  *Please DO NOT go directly from our office to pick up this medication! Give the pharmacy 1 day to process the prescription as this is compounded and takes time to make.  You have been scheduled for a colonoscopy. Please follow written instructions given to you at your visit today.  Please pick up your prep supplies at the pharmacy within the next 1-3 days. If you use inhalers (even only as needed), please bring them with you on the day of your procedure.  If you are age 52 or older, your body mass index should be between 23-30. Your Body mass index is 26.31 kg/m. If this is out of the aforementioned range listed, please consider follow up with your Primary Care Provider.  If you are age 48 or younger, your body mass index should be between 19-25. Your Body mass index is 26.31 kg/m. If this is out of the aformentioned range listed, please consider follow up with your Primary Care Provider.   ________________________________________________________  The [facility] providers would like to encourage you to use [facility] to communicate with providers for non-urgent requests or questions.  Due to long hold times on the telephone, sending your provider a message by [facility] may be a faster and more efficient way to get a response.  Please allow 48 business hours for a response.  Please remember that this is for non-urgent requests.  _______________________________________________________

## 2022-01-12 NOTE — Progress Notes (Signed)
Agree with the assessment and plan as outlined by Jennifer Lemmon, PA-C. ? ?Irish Piech E. Brynlyn Dade, MD ? ?

## 2022-02-26 ENCOUNTER — Encounter: Payer: Medicaid Other | Admitting: Gastroenterology

## 2022-03-05 ENCOUNTER — Other Ambulatory Visit: Payer: Self-pay | Admitting: Internal Medicine

## 2022-03-11 ENCOUNTER — Telehealth: Payer: Self-pay | Admitting: Physician Assistant

## 2022-03-11 NOTE — Telephone Encounter (Signed)
Inbound call from patient stating he has developed a rash since last week.  Please advise. ?

## 2022-03-11 NOTE — Telephone Encounter (Signed)
Returned call to patient. Pt reports that for the past 2-3 weeks he has been experiencing some anal itching. He then scratched the area and developed a rash about a week ago. Pt was not sure if this is just the healing process. He denied any straining or constipation, reports that his stools are easy to pass. He had only been using the Diltiazem BID, he has not used the cream in the past 3 days. I told pt that it will take a very long time for the fissure to heal due to the location. His colonoscopy with Dr. Candis Schatz is not until May. Please advise, thanks.  ?

## 2022-03-12 NOTE — Telephone Encounter (Signed)
Patient returned your phone call.  Please call back at your convenience.  Thank you. °

## 2022-03-12 NOTE — Telephone Encounter (Signed)
Hard to tell what is really going on without seeing the area unfortunately. If he is concerned or bothered by this rash then he will need to come back in to be seen by one of the Apps- otherwise he can continue Diltiazem as prescribed.  ?Thanks-JLL ? ?Lm on vm for patient to return call. ?

## 2022-03-12 NOTE — Telephone Encounter (Signed)
Returned call to patient. He is aware that he will need an appt for assessment. Pt states that he would like to see a male provider. He has been scheduled to see Dr. Candis Schatz Tuesday, 03/16/22 at 2:30 pm. Pt verbalized understanding and had no other concerns at the end of the call. ?

## 2022-03-16 ENCOUNTER — Encounter: Payer: Self-pay | Admitting: Gastroenterology

## 2022-03-16 ENCOUNTER — Ambulatory Visit (INDEPENDENT_AMBULATORY_CARE_PROVIDER_SITE_OTHER): Payer: Medicaid Other | Admitting: Gastroenterology

## 2022-03-16 VITALS — BP 120/70 | HR 76 | Ht 67.0 in | Wt 167.0 lb

## 2022-03-16 DIAGNOSIS — K602 Anal fissure, unspecified: Secondary | ICD-10-CM

## 2022-03-16 DIAGNOSIS — R21 Rash and other nonspecific skin eruption: Secondary | ICD-10-CM | POA: Diagnosis not present

## 2022-03-16 MED ORDER — CLOTRIMAZOLE-BETAMETHASONE 1-0.05 % EX CREA: 1.0000 "application " | TOPICAL_CREAM | Freq: Two times a day (BID) | CUTANEOUS | 0 refills | Status: DC
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
## 2022-03-16 NOTE — Patient Instructions (Signed)
If you are age 31 or older, your body mass index should be between 23-30. Your Body mass index is 26.16 kg/m?Marland Kitchen If this is out of the aforementioned range listed, please consider follow up with your Primary Care Provider. ? ?If you are age 62 or younger, your body mass index should be between 19-25. Your Body mass index is 26.16 kg/m?Marland Kitchen If this is out of the aformentioned range listed, please consider follow up with your Primary Care Provider.  ? ?You have been scheduled for a colonoscopy. Please follow written instructions given to you at your visit today.  ?Please pick up your prep supplies at the pharmacy within the next 1-3 days. ?If you use inhalers (even only as needed), please bring them with you on the day of your procedure. ? ? ?We have sent the following medications to your pharmacy for you to pick up at your convenience: Clotrimazole 1 % twice daily for 2 weeks.  ? ?Continue diltiazem and Psyllium.  ? ?The [place] [facility] providers would like to encourage you to use [facility] to communicate with providers for non-urgent requests or questions.  Due to long hold times on the telephone, sending your provider a message by [facility] may be a faster and more efficient way to get a response.  Please allow 48 business hours for a response.  Please remember that this is for non-urgent requests.  ? ?It was a pleasure to see you today! ? ?Thank you for trusting me with your gastrointestinal care!   ? ?Scott E.Candis Schatz, MD  ? ? ? ?

## 2022-03-18 ENCOUNTER — Encounter: Payer: Self-pay | Admitting: Gastroenterology

## 2022-04-14 ENCOUNTER — Ambulatory Visit
Admission: RE | Admit: 2022-04-14 | Discharge: 2022-04-14 | Disposition: A | Discharge status: Home / Self Care | Payer: Medicaid Other | Source: Ambulatory Visit | Attending: Family Medicine | Admitting: Family Medicine

## 2022-04-14 ENCOUNTER — Other Ambulatory Visit: Payer: Self-pay | Admitting: Family Medicine

## 2022-04-14 DIAGNOSIS — Z111 Encounter for screening for respiratory tuberculosis: Secondary | ICD-10-CM

## 2022-05-07 ENCOUNTER — Ambulatory Visit (AMBULATORY_SURGERY_CENTER): Payer: Medicaid Other | Admitting: Gastroenterology

## 2022-05-07 ENCOUNTER — Encounter: Payer: Self-pay | Admitting: Gastroenterology

## 2022-05-07 VITALS — BP 112/69 | HR 67 | Temp 97.7°F | Resp 20 | Ht 67.0 in | Wt 168.0 lb

## 2022-05-07 DIAGNOSIS — D128 Benign neoplasm of rectum: Secondary | ICD-10-CM | POA: Diagnosis not present

## 2022-05-07 DIAGNOSIS — K602 Anal fissure, unspecified: Secondary | ICD-10-CM | POA: Diagnosis not present

## 2022-05-07 DIAGNOSIS — K621 Rectal polyp: Secondary | ICD-10-CM

## 2022-05-07 MED ORDER — SODIUM CHLORIDE 0.9 % IV SOLN: 500.0000 mL | Freq: Once | INTRAVENOUS | Status: DC

## 2022-05-07 NOTE — Patient Instructions (Addendum)
Handout was given to your care partner on polyps. You may resume your current medications today. Await biopsy results.  May take 1-3 weeks to receive pathology results. Recommend referral to surgeon  for consideration of Botox infection vs. Lateral Sphincterotomy for chronic non- healing Anal Fissure.  The office will call you with this appointment.  Continue fiber supplement for constipation and fissure, topical Diltiazem and Sitz Baths. Please call if any questions or concerns.      YOU HAD AN ENDOSCOPIC PROCEDURE TODAY AT [facility]:   Refer to the procedure report that was given to you for any specific questions about what was found during the examination.  If the procedure report does not answer your questions, please call your gastroenterologist to clarify.  If you requested that your care partner not be given the details of your procedure findings, then the procedure report has been included in a sealed envelope for you to review at your convenience later.  YOU SHOULD EXPECT: Some feelings of bloating in the abdomen. Passage of more gas than usual.  Walking can help get rid of the air that was put into your GI tract during the procedure and reduce the bloating. If you had a lower endoscopy (such as a colonoscopy or flexible sigmoidoscopy) you may notice spotting of blood in your stool or on the toilet paper. If you underwent a bowel prep for your procedure, you may not have a normal bowel movement for a few days.  Please Note:  You might notice some irritation and congestion in your nose or some drainage.  This is from the oxygen used during your procedure.  There is no need for concern and it should clear up in a day or so.  SYMPTOMS TO REPORT IMMEDIATELY:  Following lower endoscopy (colonoscopy or flexible sigmoidoscopy):  Excessive amounts of blood in the stool  Significant tenderness or worsening of abdominal pains  Swelling of the abdomen that is new, acute  Fever  of 100F or higher  For urgent or emergent issues, a gastroenterologist can be reached at any hour by calling [phone]. Do not use MyChart messaging for urgent concerns.    DIET:  We do recommend a small meal at first, but then you may proceed to your regular diet.  Drink plenty of fluids but you should avoid alcoholic beverages for 24 hours.  ACTIVITY:  You should plan to take it easy for the rest of today and you should NOT DRIVE or use heavy machinery until tomorrow (because of the sedation medicines used during the test).    FOLLOW UP: Our staff will call the number listed on your records 48-72 hours following your procedure to check on you and address any questions or concerns that you may have regarding the information given to you following your procedure. If we do not reach you, we will leave a message.  We will attempt to reach you two times.  During this call, we will ask if you have developed any symptoms of COVID 19. If you develop any symptoms (ie: fever, flu-like symptoms, shortness of breath, cough etc.) before then, please call [phone].  If you test positive for Covid 19 in the 2 weeks post procedure, please call and report this information to [place].    If any biopsies were taken you will be contacted by phone or by letter within the next 1-3 weeks.  Please call us at [phone] if you have not heard about the biopsies in 3  weeks.    SIGNATURES/CONFIDENTIALITY: You and/or your care partner have signed paperwork which will be entered into your electronic medical record.  These signatures attest to the fact that that the information above on your After Visit Summary has been reviewed and is understood.  Full responsibility of the confidentiality of this discharge information lies with you and/or your care-partner.

## 2022-05-07 NOTE — Progress Notes (Signed)
A and O x3. Report to RN. Tolerated MAC anesthesia well. 

## 2022-05-07 NOTE — Op Note (Signed)
[place] Patient Name: Ryan Hayes [facility] Procedure Date: 05/07/2022 9:46 AM MRN: [id number] Endoscopist: Nicki Reaper E. Candis Schatz , MD Age: 31 Referring MD:  Date of Birth: [birth year] Gender: Male Account #: [id number] Procedure:                Colonoscopy Indications:              Exclusion of Crohn's disease, Chronic anal fissure,                            rectal pain Medicines:                Monitored Anesthesia Care Procedure:                Pre-Anesthesia Assessment:                           - Prior to the procedure, a History and Physical                            was performed, and patient medications and                            allergies were reviewed. The patient's tolerance of                            previous anesthesia was also reviewed. The risks                            and benefits of the procedure and the sedation                            options and risks were discussed with the patient.                            All questions were answered, and informed consent                            was obtained. Prior Anticoagulants: The patient has                            taken no previous anticoagulant or antiplatelet                            agents. ASA Grade Assessment: II - A patient with                            mild systemic disease. After reviewing the risks                            and benefits, the patient was deemed in                            satisfactory condition to undergo the procedure.  After obtaining informed consent, the colonoscope                            was passed under direct vision. Throughout the                            procedure, the patient's blood pressure, pulse, and                            oxygen saturations were monitored continuously. The                            Olympus CF-HQ190L [id number]) Colonoscope was                            introduced through the anus and  advanced to the the                            terminal ileum, with identification of the                            appendiceal orifice and IC valve. The colonoscopy                            was performed without difficulty. The patient                            tolerated the procedure well. The quality of the                            bowel preparation was good. The terminal ileum,                            ileocecal valve, appendiceal orifice, and rectum                            were photographed. The bowel preparation used was                            SUPREP via split dose instruction. Scope In: 10:00:39 AM Scope Out: 10:13:22 AM Scope Withdrawal Time: 0 hours 7 minutes 51 seconds  Total Procedure Duration: 0 hours 12 minutes 43 seconds  Findings:                 An anal fissure was found on perianal exam.                           The digital rectal exam was normal. Pertinent                            negatives include normal sphincter tone and no                            palpable rectal lesions.  A 4 mm polyp was found in the rectum. The polyp was                            sessile. The polyp was removed with a cold snare.                            Resection and retrieval were complete. Estimated                            blood loss was minimal.                           The exam was otherwise normal throughout the                            examined colon.                           The terminal ileum appeared normal.                           The retroflexed view of the distal rectum and anal                            verge was normal and showed no anal or rectal                            abnormalities. Complications:            No immediate complications. Estimated Blood Loss:     Estimated blood loss was minimal. Impression:               - A small posterior midling anal fissure was found                            on perianal exam.                            - One 4 mm polyp in the rectum, removed with a cold                            snare. Resected and retrieved.                           - The examined portion of the ileum was normal.                           - The distal rectum and anal verge are normal on                            retroflexion view.                           - No evidence of Crohn's disease as cause of  nonhealing anal fissure Recommendation:           - Patient has a contact number available for                            emergencies. The signs and symptoms of potential                            delayed complications were discussed with the                            patient. Return to normal activities tomorrow.                            Written discharge instructions were provided to the                            patient.                           - Resume previous diet.                           - Continue present medications.                           - Await pathology results.                           - Recommend referral to surgery for consideration                            of Botox injection vs lateral sphincterotomy for                            chronic non-healing fissure.                           - Continue fiber supplementation, topical diltiazem                            and Sitz baths [name] E. Candis Schatz, MD 05/07/2022 10:21:22 AM This report has been signed electronically.
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
## 2022-05-07 NOTE — Progress Notes (Signed)
No problems noted in the recovery room. maw   Pt request appointment with surgeon asap please. Maw  Also pt was given a high fiber diet to read over. maw

## 2022-05-07 NOTE — Progress Notes (Signed)
Called to room to assist during endoscopic procedure.  Patient ID and intended procedure confirmed with present staff. Received instructions for my participation in the procedure from the performing physician.  

## 2022-05-07 NOTE — Progress Notes (Signed)
Vitals-DT  History reviewed. 

## 2022-05-10 ENCOUNTER — Telehealth: Payer: Self-pay

## 2022-05-10 ENCOUNTER — Telehealth: Payer: Self-pay | Admitting: *Deleted

## 2022-05-10 NOTE — Telephone Encounter (Signed)
No answer for post procedure callback. Unable to leave VM. 

## 2022-05-10 NOTE — Telephone Encounter (Signed)
Referral faxed to [facility] for consideration of botox inj vs lateral sphincterotomy for chronic non-healing fissure.

## 2022-05-10 NOTE — Telephone Encounter (Signed)
First attempt follow up call to pt, no answer. 

## 2022-05-11 NOTE — Progress Notes (Signed)
Mr. Meech,  Kermit Balo news: the polyp that I removed during your recent examination was NOT precancerous.  You should continue to follow current colorectal cancer screening guidelines with a repeat colonoscopy at age 31.  As discussed, I recommend you meet with a surgeon to discuss further treatment options for your persistent anal fissure.  Vaughan Basta, can you please enter a surgery consult for refractory chronic anal fissure?

## 2022-05-12 NOTE — Telephone Encounter (Signed)
Angie from [facility] called regarding patient. Angie states the patient insurance does not cover referral Angie states patient will be considered self pay patient with a fee of $245.  Thank you.

## 2022-05-12 NOTE — Telephone Encounter (Signed)
Dr. Candis Schatz please see note below. Do you want pt to be referred to a different facility since [facility] does not accept his insurance?

## 2022-05-18 ENCOUNTER — Telehealth: Payer: Self-pay | Admitting: Physician Assistant

## 2022-05-18 MED ORDER — AMBULATORY NON FORMULARY MEDICATION: 1 refills | Status: AC

## 2022-05-18 NOTE — Telephone Encounter (Signed)
Left message for pt to call back  °

## 2022-05-18 NOTE — Telephone Encounter (Signed)
Inbound call from  patient stating that he is in need of a refill for Diltiazem. Patient also stated that he was referred to a surgeon and has not heard anything from anyone and is seeking advice on what he needs to do. Please advise.

## 2022-05-18 NOTE — Telephone Encounter (Signed)
Dr. Candis Schatz saw this patient last do you know anything about the referral to surgery? I'll refill the Dilitazem.

## 2022-05-19 NOTE — Telephone Encounter (Signed)
Unable to reach pt by phone. Letter mailed to pt requesting he contact our office regarding his surgical referral.

## 2022-05-19 NOTE — Telephone Encounter (Signed)
Pt called in and spoke with [facility], she let him know that his insurance was not accepted by [facility] and Dr. Candis Schatz had mentioned referring him to [facility]. He is aware and was ok with that referral. Referral was faxed to [facility] at [phone] requesting asap appt for consideration of botox injection vs lateral sphincterotomy for chronic non-healing fissure.

## 2022-05-19 NOTE — Telephone Encounter (Signed)
Patient called to follow up on previous refill request for the Diltiazem medication requested a call back.

## 2022-05-19 NOTE — Telephone Encounter (Signed)
Returned patients call. I let him know that [facility] did not take his insurance and he would have to pay out of pocket. I also informed patient that Dr.Cunningham had suggested referring him to [facility] instead and would let him know when we had more information on the referral.

## 2022-06-12 ENCOUNTER — Encounter: Payer: Self-pay | Admitting: *Deleted

## 2022-06-16 ENCOUNTER — Emergency Department (HOSPITAL_COMMUNITY)
Admission: EM | Admit: 2022-06-16 | Discharge: 2022-06-16 | Discharge status: Against Medical Advice | Payer: Medicaid Other

## 2022-06-16 NOTE — ED Notes (Signed)
Pt left ED.

## 2022-06-18 ENCOUNTER — Encounter (HOSPITAL_COMMUNITY): Payer: Self-pay | Admitting: Emergency Medicine

## 2022-06-18 ENCOUNTER — Telehealth: Payer: Self-pay | Admitting: *Deleted

## 2022-06-18 ENCOUNTER — Ambulatory Visit (HOSPITAL_COMMUNITY)
Admission: EM | Admit: 2022-06-18 | Discharge: 2022-06-18 | Disposition: A | Discharge status: Home / Self Care | Payer: Medicaid Other | Attending: Physician Assistant | Admitting: Physician Assistant

## 2022-06-18 DIAGNOSIS — G44209 Tension-type headache, unspecified, not intractable: Secondary | ICD-10-CM

## 2022-06-18 MED ORDER — BACLOFEN 10 MG PO TABS: 10.0000 mg | ORAL_TABLET | Freq: Every day | ORAL | 0 refills | Status: DC | PRN

## 2022-06-18 MED ORDER — KETOROLAC TROMETHAMINE 30 MG/ML IJ SOLN
INTRAMUSCULAR | Status: AC
  Filled 2022-06-18: qty 1

## 2022-06-18 MED ORDER — KETOROLAC TROMETHAMINE 30 MG/ML IJ SOLN
30.0000 mg | Freq: Once | INTRAMUSCULAR | Status: AC
  Administered 2022-06-18: 30 mg via INTRAMUSCULAR

## 2022-06-18 NOTE — ED Triage Notes (Signed)
Pt is present today with c/o HA. Pt states his HA started x3 days ago  Pt states that his HA is located on his forehead and behind both eyes

## 2022-06-18 NOTE — Discharge Instructions (Signed)
I have that your headache is improved with your medication.  Do not take NSAIDs including aspirin, ibuprofen/Advil, naproxen/Aleve for 12 hours since you are given this injection.  You can use Tylenol/acetaminophen.  We will start baclofen at night.  This will make you sleepy so do not drive or drink alcohol with taking it.  Make sure you eat small frequent meals and drink plenty of fluid.  I do recommend that if your symptoms are not improving you follow-up with neurology; call to schedule an appointment.  If anything worsens and you have visual change, severe headache, nausea/vomiting, weakness, trouble speaking you need to go to the emergency room immediately.

## 2022-06-18 NOTE — Telephone Encounter (Signed)
Call from pt requesting an appt this afternoon to be seen for "bad h/a". Informed pt we are closed Friday afternoons. He stated he needs to be seen  today; c/o right-sided head pain, sensitive to light x 4-5 days; Tylenol does not help. He went to the ER on 6/28 but left; stated he thought he would feel better. His BP was 158/90. Reiterated to pt , our office is closed this afternoon, no one is here to see him and to go to UC or ER. He stated "ok".
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
## 2022-07-07 ENCOUNTER — Encounter (HOSPITAL_COMMUNITY): Payer: Self-pay | Admitting: Physician Assistant

## 2022-07-07 ENCOUNTER — Ambulatory Visit (HOSPITAL_COMMUNITY)
Admission: EM | Admit: 2022-07-07 | Discharge: 2022-07-07 | Disposition: A | Discharge status: Home / Self Care | Payer: Medicaid Other | Attending: Physician Assistant | Admitting: Physician Assistant

## 2022-07-07 DIAGNOSIS — K047 Periapical abscess without sinus: Secondary | ICD-10-CM

## 2022-07-07 DIAGNOSIS — K0889 Other specified disorders of teeth and supporting structures: Secondary | ICD-10-CM

## 2022-07-07 MED ORDER — AMOXICILLIN-POT CLAVULANATE 875-125 MG PO TABS: 1.0000 | ORAL_TABLET | Freq: Two times a day (BID) | ORAL | 0 refills | Status: DC

## 2022-07-07 MED ORDER — IBUPROFEN 600 MG PO TABS: 600.0000 mg | ORAL_TABLET | Freq: Three times a day (TID) | ORAL | 0 refills | Status: DC | PRN
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
## 2022-07-07 NOTE — Discharge Instructions (Signed)
I am concerned that you have an infection.  Start Augmentin twice daily.  Take ibuprofen for pain relief.  Do not take NSAIDs including aspirin, ibuprofen/Advil, naproxen/Aleve with this medication as it can cause stomach bleeding.  You can use Tylenol as well as gargling with warm salt water for symptom relief.  Follow-up with a dentist.  If you have any worsening symptoms including swelling of your throat, fever, nausea, vomiting you need to be seen immediately.

## 2022-07-07 NOTE — ED Triage Notes (Signed)
Pt reports right side mouth pain x 2-3 days.

## 2022-08-09 ENCOUNTER — Emergency Department (HOSPITAL_COMMUNITY)
Admission: EM | Admit: 2022-08-09 | Discharge: 2022-08-10 | Disposition: A | Discharge status: Home / Self Care | Payer: Medicaid Other | Attending: Emergency Medicine | Admitting: Emergency Medicine

## 2022-08-09 ENCOUNTER — Other Ambulatory Visit: Payer: Self-pay

## 2022-08-09 ENCOUNTER — Encounter (HOSPITAL_COMMUNITY): Payer: Self-pay | Admitting: Emergency Medicine

## 2022-08-09 DIAGNOSIS — T7840XA Allergy, unspecified, initial encounter: Secondary | ICD-10-CM | POA: Diagnosis not present

## 2022-08-09 DIAGNOSIS — T782XXA Anaphylactic shock, unspecified, initial encounter: Secondary | ICD-10-CM | POA: Insufficient documentation

## 2022-08-09 DIAGNOSIS — R220 Localized swelling, mass and lump, head: Secondary | ICD-10-CM | POA: Insufficient documentation

## 2022-08-09 MED ORDER — ALUM & MAG HYDROXIDE-SIMETH 200-200-20 MG/5ML PO SUSP
15.0000 mL | Freq: Once | ORAL | Status: AC
  Administered 2022-08-09: 15 mL via ORAL
  Filled 2022-08-09: qty 30

## 2022-08-09 MED ORDER — PREDNISONE 20 MG PO TABS
60.0000 mg | ORAL_TABLET | Freq: Once | ORAL | Status: AC
  Administered 2022-08-09: 60 mg via ORAL
  Filled 2022-08-09: qty 3

## 2022-08-09 MED ORDER — FAMOTIDINE 20 MG PO TABS
20.0000 mg | ORAL_TABLET | Freq: Once | ORAL | Status: AC
  Administered 2022-08-09: 20 mg via ORAL
  Filled 2022-08-09: qty 1

## 2022-08-09 MED ORDER — EPINEPHRINE 0.3 MG/0.3ML IJ SOAJ: 0.3000 mg | INTRAMUSCULAR | 0 refills | Status: AC | PRN

## 2022-08-09 NOTE — ED Provider Notes (Signed)
00:00: Assumed care of patient  S/p nut bar consumption, anaphylaxis, epi by EMS @ 2100, watch for 4 hours Periorbital edema  Physical Exam  BP 106/63   Pulse 63   Resp 15   SpO2 98%   Physical Exam  Procedures  Procedures  ED Course / MDM    Medical Decision Making Risk OTC drugs. Prescription drug management.   ***

## 2022-08-09 NOTE — ED Triage Notes (Signed)
Patient with allergic reaction with hives and itching, ate a nut bar that had cashews and almonds a hour pta of EMS.  Had some heart burn.  Urticaria, congestion and itching.  Patient received '125mg'$  solumedrol, 0.'3mg'$  epi and '50mg'$  PO Benadryl.  Face looks puffy, does not have stridor, no wheezing.  Mild shortness of breath.  No previous reactions.

## 2022-08-09 NOTE — ED Provider Notes (Signed)
[facility] EMERGENCY DEPARTMENT Provider Note   CSN: [id number] Arrival date & time: 08/09/22  2114     History  Chief Complaint  Patient presents with   Allergic Reaction    Ryan Hayes [name] is a 31 y.o. male with no pertinent past medical history presents to the emergency department after a presumed anaphylactic reaction.  Patient states that he was working late when he had been given a protein bar from a friend.  Patient reportedly consumed a "nut bar."  He states that after consuming this he had felt slightly nauseous and had a burning sensation in his epigastrium.  He had gone home and attempted to induce vomitus.  He had done so successfully and thereafter had experienced difficulty breathing which had prompted him to summon EMS.  Upon EMSs arrival they had noted facial swelling and had given the patient intramuscular epinephrine at approximately 2100 in addition to benedryl.  Patient states that shortly thereafter he was able to breathe normally again.  He states that he still feels a burning sensation in his epigastrium pain that he does feel that he has some periocular swelling but denies any associated shortness of breath at this time.  He does note that he has had a minor rash reaction to almonds in the past and did note that his protein bar this evening contained [place] incident.  He has never had an anaphylactic reaction prior to today.   Allergic Reaction Presenting symptoms: rash        Home Medications Prior to Admission medications   Medication Sig Start Date End Date Taking? Authorizing Provider  AMBULATORY NON FORMULARY MEDICATION Medication Name: Diltiazem 2% - Using your index finger, apply a small amount of medication inside the rectum up to your first knuckle/joint three times daily x 6-8 weeks. 05/18/22   Levin Erp, PA  amoxicillin-clavulanate (AUGMENTIN) 875-125 MG tablet Take 1 tablet by mouth every 12 (twelve) hours.  07/07/22   Raspet, Derry Skill, PA-C  baclofen (LIORESAL) 10 MG tablet Take 1 tablet (10 mg total) by mouth daily as needed for muscle spasms. 06/18/22   Raspet, Derry Skill, PA-C  clotrimazole-betamethasone (LOTRISONE) cream Apply 1 application. topically 2 (two) times daily. 03/16/22   Daryel November, MD  FLUoxetine (PROZAC) 20 MG capsule TAKE 1 CAPSULE BY MOUTH EVERY DAY 03/05/22   Maudie Mercury, MD  ibuprofen (ADVIL) 600 MG tablet Take 1 tablet (600 mg total) by mouth every 8 (eight) hours as needed. 07/07/22   Raspet, Derry Skill, PA-C      Allergies    Almond oil    Review of Systems   Review of Systems  Constitutional:  Negative for chills and fever.  HENT:  Negative for ear pain and sore throat.   Eyes:  Negative for pain and visual disturbance.  Respiratory:  Positive for shortness of breath. Negative for cough.   Cardiovascular:  Negative for chest pain and palpitations.  Gastrointestinal:  Negative for abdominal pain and vomiting.  Genitourinary:  Negative for dysuria and hematuria.  Musculoskeletal:  Negative for arthralgias and back pain.  Skin:  Positive for rash. Negative for color change.  Neurological:  Negative for seizures and syncope.  All other systems reviewed and are negative.   Physical Exam Updated Vital Signs There were no vitals taken for this visit. Physical Exam Vitals and nursing note reviewed.  Constitutional:      General: He is not in acute distress.    Appearance: He is  well-developed.     Comments: Upon entering the exam room where the patient is sitting in bed awake and alert no acute distress.  He does have some degree of facial swelling.  HENT:     Head: Normocephalic and atraumatic.  Eyes:     Conjunctiva/sclera: Conjunctivae normal.  Cardiovascular:     Rate and Rhythm: Normal rate and regular rhythm.     Heart sounds: No murmur heard. Pulmonary:     Effort: Pulmonary effort is normal. No respiratory distress.     Breath sounds: Normal breath  sounds.     Comments: No stridor appreciated.  Patient is saturating well on room air.  No increased work of breathing.  No wheezes appreciated. Abdominal:     Palpations: Abdomen is soft.     Tenderness: There is no abdominal tenderness.     Comments: Soft nondistended and tender.  Musculoskeletal:        General: No swelling.     Cervical back: Neck supple.  Skin:    General: Skin is warm and dry.     Capillary Refill: Capillary refill takes less than 2 seconds.  Neurological:     Mental Status: He is alert.  Psychiatric:        Mood and Affect: Mood normal.     ED Results / Procedures / Treatments   Labs (all labs ordered are listed, but only abnormal results are displayed) Labs Reviewed - No data to display  EKG None  Radiology No results found.  Procedures Procedures    Medications Ordered in ED Medications - No data to display  ED Course/ Medical Decision Making/ A&P                           Medical Decision Making Risk OTC drugs. Prescription drug management.   Patient presents the emergency department hemodynamically stable with mild periorbital edema in the absence of airway compromise or wheezing.  Patient had responded to epinephrine as above.  Presentation is most consistent with anaphylaxis.  Patient had received epinephrine and Benadryl via EMS.  Will complete regimen including oral prednisone and famotidine.  Patient is also complaining of some burning epigastric reflux associated pain and is requesting Maalox.  Will provide Maalox per patient request.  Serial reassessment reveals that the patient remains hemodynamically stable with subsequent slight improvement in his symptoms.  Patient still has some degree of periorbital edema although is otherwise well-appearing.  Patient care was transitioned to the, and provider at 0000 with discussion of plan to reassess and if well-appearing discharge at 0100 with prescription for  EpiPen.            Final Clinical Impression(s) / ED Diagnoses Final diagnoses:  Anaphylaxis, initial encounter    Rx / DC Orders ED Discharge Orders     None         Levie Heritage, MD 08/09/22 1017    Blanchie Dessert, MD 08/10/22 1627

## 2022-08-09 NOTE — ED Notes (Signed)
Mild facial swelling noted, pt reports generalized itching. No SOB or difficulty breathing/swallowing

## 2022-08-09 NOTE — Discharge Instructions (Addendum)
You were seen in the emergency room today for an allergic reaction.  We think it was related to one of the nuts to be in the nut bar.  Please refrain from eating nuts until we determine which not has caused this.  We are giving a prescription for an EpiPen.  Please fill this soon as possible and bring it with you at all times.  If you feel like you are having a bad allergic reaction again in it is difficult to breathe please use the EpiPen.  If you ever use the EpiPen you must come to the emergency department for evaluation.  Please see instructions regarding EpiPen use as above.  Please follow up with an allergist.  Return to the ER for new or worsening symptoms or any other concerns.

## 2022-08-10 NOTE — ED Notes (Signed)
RN reviewed discharge instructions with pt. Pt verbalized understanding and had no further questions. VSS upon discharge.  

## 2022-09-08 ENCOUNTER — Other Ambulatory Visit: Payer: Self-pay

## 2022-09-08 ENCOUNTER — Encounter (HOSPITAL_COMMUNITY): Payer: Self-pay | Admitting: Emergency Medicine

## 2022-09-08 ENCOUNTER — Ambulatory Visit (HOSPITAL_COMMUNITY)
Admission: EM | Admit: 2022-09-08 | Discharge: 2022-09-08 | Disposition: A | Discharge status: Home / Self Care | Payer: Medicaid Other | Attending: Family Medicine | Admitting: Family Medicine

## 2022-09-08 DIAGNOSIS — R1013 Epigastric pain: Secondary | ICD-10-CM | POA: Insufficient documentation

## 2022-09-08 LAB — COMPREHENSIVE METABOLIC PANEL
ALT: 54 U/L — ABNORMAL HIGH (ref 0–44)
AST: 29 U/L (ref 15–41)
Albumin: 4.2 g/dL (ref 3.5–5.0)
Alkaline Phosphatase: 142 U/L — ABNORMAL HIGH (ref 38–126)
Anion gap: 10 (ref 5–15)
BUN: 9 mg/dL (ref 6–20)
CO2: 25 mmol/L (ref 22–32)
Calcium: 9.5 mg/dL (ref 8.9–10.3)
Chloride: 104 mmol/L (ref 98–111)
Creatinine, Ser: 0.73 mg/dL (ref 0.61–1.24)
GFR, Estimated: >60 mL/min (ref >60)
Glucose, Bld: 91 mg/dL (ref 70–99)
Potassium: 3.8 mmol/L (ref 3.5–5.1)
Sodium: 139 mmol/L (ref 135–145)
Total Bilirubin: 0.4 mg/dL (ref 0.3–1.2)
Total Protein: 6.9 g/dL (ref 6.5–8.1)

## 2022-09-08 LAB — CBC WITH DIFFERENTIAL/PLATELET
Abs Immature Granulocytes: 0.06 10*3/uL (ref 0.00–0.07)
Basophils Absolute: 0.1 10*3/uL (ref 0.0–0.1)
Basophils Relative: 1 %
Eosinophils Absolute: 0.7 10*3/uL — ABNORMAL HIGH (ref 0.0–0.5)
Eosinophils Relative: 9 %
HCT: 40 % (ref 39.0–52.0)
Hemoglobin: 12.8 g/dL — ABNORMAL LOW (ref 13.0–17.0)
Immature Granulocytes: 1 %
Lymphocytes Relative: 29 %
Lymphs Abs: 2.1 10*3/uL (ref 0.7–4.0)
MCH: 26.4 pg (ref 26.0–34.0)
MCHC: 32 g/dL (ref 30.0–36.0)
MCV: 82.6 fL (ref 80.0–100.0)
Monocytes Absolute: 0.6 10*3/uL (ref 0.1–1.0)
Monocytes Relative: 8 %
Neutro Abs: 4 10*3/uL (ref 1.7–7.7)
Neutrophils Relative %: 52 %
Platelets: 295 10*3/uL (ref 150–400)
RBC: 4.84 MIL/uL (ref 4.22–5.81)
RDW: 13.4 % (ref 11.5–15.5)
WBC: 7.4 10*3/uL (ref 4.0–10.5)
nRBC: 0 % (ref 0.0–0.2)

## 2022-09-08 LAB — LIPASE, BLOOD: Lipase: 26 U/L (ref 11–51)

## 2022-09-08 MED ORDER — PANTOPRAZOLE SODIUM 20 MG PO TBEC: 20.0000 mg | DELAYED_RELEASE_TABLET | Freq: Every day | ORAL | 0 refills | Status: DC

## 2022-09-08 MED ORDER — SUCRALFATE 1 G PO TABS: 1.0000 g | ORAL_TABLET | Freq: Three times a day (TID) | ORAL | 0 refills | Status: DC

## 2022-09-08 NOTE — ED Triage Notes (Addendum)
Abdominal pain started this morning.  Patient reports an episode occurred last week.  Though to be related to an antibiotic he is taking for a tooth.  Patient is very specific about pain on right side of torso.  Patient reports this is the second round of an antibiotic.  They have been back to back   loose stool yesterday, no vomiting.  Complains of general weakness

## 2022-09-08 NOTE — Discharge Instructions (Signed)
You have been seen today for abdominal pain. Your evaluation was not suggestive of any emergent condition requiring medical intervention at this time. However, some abdominal problems make take more time to appear. Therefore, it is very important for you to pay attention to any new symptoms or worsening of your current condition.  Please return here or to the Emergency Department immediately should you begin to feel worse in any way or have any of the following symptoms: increasing or different abdominal pain, persistent vomiting, inability to drink fluids, fevers, or shaking chills.   You have had labs (blood work) sent today. We will call you with any significant abnormalities or if there is need to begin or change treatment or pursue further follow up.  You may also review your test results online through [facility]. If you do not have a MyChart account, instructions to sign up should be on your discharge paperwork.
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
## 2022-10-11 ENCOUNTER — Ambulatory Visit (INDEPENDENT_AMBULATORY_CARE_PROVIDER_SITE_OTHER): Payer: Medicaid Other | Admitting: Student

## 2022-10-11 VITALS — BP 127/77 | HR 91 | Temp 98.4°F | Ht 64.96 in | Wt 173.3 lb

## 2022-10-11 DIAGNOSIS — K602 Anal fissure, unspecified: Secondary | ICD-10-CM

## 2022-10-11 DIAGNOSIS — R1013 Epigastric pain: Secondary | ICD-10-CM | POA: Diagnosis not present

## 2022-10-11 DIAGNOSIS — F411 Generalized anxiety disorder: Secondary | ICD-10-CM | POA: Diagnosis not present

## 2022-10-11 DIAGNOSIS — D649 Anemia, unspecified: Secondary | ICD-10-CM

## 2022-10-11 DIAGNOSIS — Z23 Encounter for immunization: Secondary | ICD-10-CM

## 2022-10-11 DIAGNOSIS — R748 Abnormal levels of other serum enzymes: Secondary | ICD-10-CM | POA: Insufficient documentation

## 2022-10-11 DIAGNOSIS — R7989 Other specified abnormal findings of blood chemistry: Secondary | ICD-10-CM | POA: Diagnosis not present

## 2022-10-11 MED ORDER — PANTOPRAZOLE SODIUM 20 MG PO TBEC: 20.0000 mg | DELAYED_RELEASE_TABLET | Freq: Two times a day (BID) | ORAL | 0 refills | Status: DC

## 2022-10-11 NOTE — Assessment & Plan Note (Signed)
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  Said that he has stopped Prozac for the last 2 months.  [name] that he is doing better.  He still have mild stress level in the setting of long hours of working but his family situation is better now.  He wishes to continue holding Prozac.  His GAD 7 score is 4, improved from 5 since last visit.  We will continue holding Prozac and monitor his symptoms.

## 2022-10-11 NOTE — Assessment & Plan Note (Signed)
Patient was seen in the emergency room on 9/20 for epigastric pain.  He was discharged with 30-day supply of Protonix and Carafate.  Patient reports similar epigastric pain last year which he tested negative for H. pylori.  Report waxing and waning epigastric pain, not exacerbated by food.  No relieving factor except for PPI.  Endorses heartburn and sour taste.  Denies diarrhea, constipation or melena.  Reports occasional hematochezia from his anal fissure.  Stated that his symptoms improved in the last few day but returned last night and more severe today.  He took 2 weeks of Protonix 20 mg after dinner and before bedtime.  He also takes sucralfate 3 times a day after meals.  Patient report finished 1 month of penicillin recently for his wisdom tooth.  He also took 2 weeks of ibuprofen 600 mg 3 times daily for tooth pain.  He states that his epigastric pain started right after finishing his antibiotics.  It seems like his epigastric pain is due to irritation and disruption of normal biome in the setting of long course antibiotic.  Possible gastric ulcer from ibuprofen as well.  H. pylori negative last year.  We will start patient on Protonix 20 mg twice daily before meals and continue sucralfate.  I educated patient on the right way to take PPI.  We will follow-up in 2 weeks.  If symptoms fail to improve or worsen, he will need a GI referral for EGD.

## 2022-10-11 NOTE — Assessment & Plan Note (Signed)
Patient is very concerned about his anal fissure.  Colonoscopy in May showed a small fissure.  States that topical diltiazem was not helping.  He denies constipation or straining.  Denies pain with bowel movement.  He states that he can no longer wear tight pants due to a sensation of warmth, rawness and pain after sitting for a long time.  He has adjusted his diet to have a more regular BM but symptoms still persisted.  He is very distressed about his anal fissure.  I could not visualize his anal fissure on exam.  There was no obvious external hemorrhoids seen.  No hematochezia noted.  It is also possible that he may have a fungal infection given his described symptoms but no skin changes seen on physical exam. I suggested using a different topical cream but he declined.  He would like to have a definitive treatments for his fissure because it has been going on for a long time.  Patient agrees with seeing surgery team for Botox injection.  Referral placed.

## 2022-10-11 NOTE — Progress Notes (Addendum)
CC: epigastric pain  HPI:  [name] is a 31 y.o. with past medical history of anal fissure, and his general anxiety disorder, who presents to the clinic for his recent ED follow-up on epigastric pain.  Please see problem based charting for detail  Past Medical History:  Diagnosis Date   Anal fissure    H. pylori infection    Childhood   Review of Systems:  per HPI  Physical Exam:  Vitals:   10/11/22 1435  BP: 127/77  Pulse: 91  Temp: 98.4 F (36.9 C)  TempSrc: Oral  SpO2: 98%  Weight: 173 lb 4.8 oz (78.6 kg)  Height: 5' 4.96" (1.65 m)   Physical Exam Constitutional:      General: He is not in acute distress. Eyes:     General:        Right eye: No discharge.        Left eye: No discharge.     Conjunctiva/sclera: Conjunctivae normal.  Cardiovascular:     Rate and Rhythm: Normal rate and regular rhythm.  Pulmonary:     Effort: Pulmonary effort is normal. No respiratory distress.     Breath sounds: Normal breath sounds. No wheezing.  Abdominal:     General: Bowel sounds are normal. There is no distension.     Palpations: Abdomen is soft.     Tenderness: There is abdominal tenderness (Mild gastric tenderness to palpation.  No right upper quadrant tenderness). There is no guarding.  Genitourinary:    Comments: No obvious fissure visualized.  No obvious external hemorrhoids, hematochezia or skin changes. Musculoskeletal:        General: Normal range of motion.  Skin:    General: Skin is warm.     Coloration: Skin is not jaundiced.     Findings: No bruising.  Neurological:     General: No focal deficit present.     Mental Status: He is alert.  Psychiatric:        Mood and Affect: Mood normal.        Behavior: Behavior normal.      Assessment & Plan:   See Encounters Tab for problem based charting.  Dyspepsia Patient was seen in the emergency room on 9/20 for epigastric pain.  He was discharged with 30-day supply of Protonix and  Carafate.  Patient reports similar epigastric pain last year which he tested negative for H. pylori.  Report waxing and waning epigastric pain, not exacerbated by food.  No relieving factor except for PPI.  Endorses heartburn and sour taste.  Denies diarrhea, constipation or melena.  Reports occasional hematochezia from his anal fissure.  Stated that his symptoms improved in the last few day but returned last night and more severe today.  He took 2 weeks of Protonix 20 mg after dinner and before bedtime.  He also takes sucralfate 3 times a day after meals.  Patient report finished 1 month of penicillin recently for his wisdom tooth.  He also took 2 weeks of ibuprofen 600 mg 3 times daily for tooth pain.  He states that his epigastric pain started right after finishing his antibiotics.  It seems like his epigastric pain is due to irritation and disruption of normal biome in the setting of long course antibiotic.  Possible gastric ulcer from ibuprofen as well.  H. pylori negative last year.  We will start patient on Protonix 20 mg twice daily before meals and continue sucralfate.  I educated patient on the right way  to take PPI.  We will follow-up in 2 weeks.  If symptoms fail to improve or worsen, he will need a GI referral for EGD.  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  Said that he has stopped Prozac for the last 2 months.  [name] that he is doing better.  He still have mild stress level in the setting of long hours of working but his family situation is better now.  He wishes to continue holding Prozac.  His GAD 7 score is 4, improved from 5 since last visit.  We will continue holding Prozac and monitor his symptoms.  Anal fissure Patient is very concerned about his anal fissure.  Colonoscopy in May showed a small fissure.  States that topical diltiazem was not helping.  He denies constipation or straining.  Denies pain with bowel movement.  He states  that he can no longer wear tight pants due to a sensation of warmth, rawness and pain after sitting for a long time.  He has adjusted his diet to have a more regular BM but symptoms still persisted.  He is very distressed about his anal fissure.  I could not visualize his anal fissure on exam.  There was no obvious external hemorrhoids seen.  No hematochezia noted.  It is also possible that he may have a fungal infection given his described symptoms but no skin changes seen on physical exam. I suggested using a different topical cream but he declined.  He would like to have a definitive treatments for his fissure because it has been going on for a long time.  Patient agrees with seeing surgery team for Botox injection.  Referral placed.    Elevated LFTs Elevated alk phos and AST seen on CMP last month.  -Recheck CBC and LFT  Addendum Alk phos and ALT continue to trend up on repeat LFT.  AST and total bilirubin were within normal limits.  -Will obtain hepatitis panel and right upper quadrant ultrasound for the next step   Patient discussed with Dr. Daryll Drown

## 2022-10-11 NOTE — Patient Instructions (Signed)
Ryan Hayes,  It was a pleasure seeing you in the clinic today.  Here is a summary what we talked about  1.  Abdominal pain: I suspect that this is GERD and possible stomach ulcer in the setting of long-term antibiotic and ibuprofen.  Please start taking Protonix 20 mg twice daily before breakfast and before supper.  Please return in 2 weeks if your symptoms do not improve.  2.  I placed a referral to surgery to evaluate for your anal fissure.  They will call you for an appointment.  3.  I am glad you are doing better off of the Prozac.  Please let us know if your anxiety symptoms get worse.  4.  I will repeat blood work for your liver function and hemoglobin.  Please return in 2 weeks  Take care,   Dr. Alfonse Spruce

## 2022-10-11 NOTE — Assessment & Plan Note (Addendum)
Elevated alk phos and AST seen on CMP last month.  -Recheck CBC and LFT  Addendum Alk phos and ALT continue to trend up on repeat LFT.  AST and total bilirubin were within normal limits.  -Will obtain hepatitis panel and right upper quadrant ultrasound for the next step

## 2022-10-12 LAB — CBC
Hematocrit: 41.5 % (ref 37.5–51.0)
Hemoglobin: 13.5 g/dL (ref 13.0–17.7)
MCH: 26.3 pg — ABNORMAL LOW (ref 26.6–33.0)
MCHC: 32.5 g/dL (ref 31.5–35.7)
MCV: 81 fL (ref 79–97)
Platelets: 308 10*3/uL (ref 150–450)
RBC: 5.13 x10E6/uL (ref 4.14–5.80)
RDW: 12.6 % (ref 11.6–15.4)
WBC: 6.1 10*3/uL (ref 3.4–10.8)

## 2022-10-12 LAB — HEPATIC FUNCTION PANEL
ALT: 66 IU/L — ABNORMAL HIGH (ref 0–44)
AST: 38 IU/L (ref 0–40)
Albumin: 4.9 g/dL (ref 4.1–5.1)
Alkaline Phosphatase: 193 IU/L — ABNORMAL HIGH (ref 44–121)
Bilirubin Total: 0.4 mg/dL (ref 0.0–1.2)
Bilirubin, Direct: 0.12 mg/dL (ref 0.00–0.40)
Total Protein: 7 g/dL (ref 6.0–8.5)

## 2022-10-12 NOTE — Addendum Note (Signed)
Addended byGaylan Gerold on: 10/12/2022 05:00 PM   Modules accepted: Orders

## 2022-10-12 NOTE — Addendum Note (Signed)
Addended byGaylan Gerold on: 10/12/2022 01:04 PM   Modules accepted: Orders

## 2022-10-13 ENCOUNTER — Other Ambulatory Visit: Payer: Medicaid Other

## 2022-10-13 DIAGNOSIS — R7989 Other specified abnormal findings of blood chemistry: Secondary | ICD-10-CM

## 2022-10-14 LAB — HCV INTERPRETATION

## 2022-10-14 LAB — ACUTE VIRAL HEPATITIS (HAV, HBV, HCV)
HCV Ab: NONREACTIVE
Hep A IgM: NEGATIVE
Hep B C IgM: NEGATIVE
Hepatitis B Surface Ag: NEGATIVE

## 2022-10-15 NOTE — Progress Notes (Signed)
Internal Medicine Clinic Attending  Case discussed with Dr. Nguyen  at the time of the visit.  We reviewed the resident's history and exam and pertinent patient test results.  I agree with the assessment, diagnosis, and plan of care documented in the resident's note.  

## 2022-10-15 NOTE — Addendum Note (Signed)
Addended by: Gilles Chiquito B on: 10/15/2022 11:21 AM   Modules accepted: Level of Service

## 2022-10-21 ENCOUNTER — Encounter: Payer: Self-pay | Admitting: Student

## 2022-10-21 ENCOUNTER — Ambulatory Visit: Payer: Medicaid Other | Admitting: Student

## 2022-10-21 ENCOUNTER — Other Ambulatory Visit: Payer: Self-pay | Admitting: *Deleted

## 2022-10-21 ENCOUNTER — Encounter: Payer: Medicaid Other | Admitting: Student

## 2022-10-21 VITALS — BP 117/70 | HR 87 | Ht 64.96 in | Wt 174.7 lb

## 2022-10-21 DIAGNOSIS — K602 Anal fissure, unspecified: Secondary | ICD-10-CM | POA: Diagnosis not present

## 2022-10-21 DIAGNOSIS — R1013 Epigastric pain: Secondary | ICD-10-CM | POA: Diagnosis present

## 2022-10-21 DIAGNOSIS — R7989 Other specified abnormal findings of blood chemistry: Secondary | ICD-10-CM

## 2022-10-21 MED ORDER — PANTOPRAZOLE SODIUM 40 MG PO TBEC: 40.0000 mg | DELAYED_RELEASE_TABLET | Freq: Two times a day (BID) | ORAL | 2 refills | Status: AC

## 2022-10-21 MED ORDER — SUCRALFATE 1 G PO TABS: 1.0000 g | ORAL_TABLET | Freq: Three times a day (TID) | ORAL | 0 refills | Status: AC

## 2022-10-21 NOTE — Telephone Encounter (Signed)
Patient called in stating he is continuing with "stomach pain." States it is below his navel. Has been taking omeprazole BID w/o relief. Did not realize he was to continue sucralfate. States he needs refill on sucralfate. He is requesting appt to be seen ASAP. Front desk was able to give him appt for today at 2:45 since there was a cancellation.

## 2022-10-21 NOTE — Patient Instructions (Addendum)
Ryan Hayes,  It was a pleasure seeing you in the clinic today.   I have reordered pantoprazole (protonix) and sucralfate for you. Please take the pantoprazole twice a day (30 minutes before breakfast and before dinner). Please take the sucralfate three times a day with meals.  I have placed a referral to the stomach doctor. They will call you to make an appointment. Our front desk team is going to help with scheduling the liver ultrasound. They will contact you to schedule this. Please come back for your next visit in 1 month.  Please call our clinic at [phone] if you have any questions or concerns. The best time to call is Monday-Friday from 9am-4pm, but there is someone available 24/7 at the same number. If you need medication refills, please notify your pharmacy one week in advance and they will send [place] a request.   Thank you for letting us take part in your care. We look forward to seeing you next time!

## 2022-10-21 NOTE — Assessment & Plan Note (Addendum)
Patient with recurrent epigastric pain and dyspepsia over the past couple of months. Pain is essentially unchanged from last visit with Dr. Alfonse Spruce (please see note from 10/23). He states that he did have improvement in his pain with the protonix and carafate to the point that pain had resolved about a week or two after his last visit. However, about 1 week ago, his pain recurred. He is pain is waxing and waning, no change with food intake. Denies any constipation, diarrhea, melena, N/V. He is unsure what is causing pain but would like further evaluation to ensure that it does not continue. We discussed referral to GI as patient may require an EGD to rule out gastritis vs PUD. He had negative H pylori testing last year and has not traveled out of country since that time. Will prescribe protonix '40mg'$  BID and carafate 1g TID for patient to continue until seen by GI.  Plan: -protonix '40mg'$  BID -carafate 1g TID -referral to GI -advised to avoid NSAIDs and given return precautions -f/u in 1 month

## 2022-10-21 NOTE — Assessment & Plan Note (Addendum)
Patient with elevated liver enzymes on recent labs. Hepatitis panel was grossly negative but did not test for hepatitis B surface antibody. Liver ultrasound was also ordered at last visit, but patient has not yet been called to schedule this. Our front desk team is looking into this to get U/S scheduled in the near future.  Plan: -f/u liver ultrasound -obtain hepatitis B surface antibody at next visit in 1 month, especially if liver ultrasound showing findings concerning for liver disease

## 2022-10-21 NOTE — Progress Notes (Signed)
   CC: f/u dyspepsia  HPI:  Mr.[name] Dalon Reichart is a 31 y.o. male with history listed below presenting to the [facility] for f/u dyspepsia. Please see individualized problem based charting for full HPI.  Past Medical History:  Diagnosis Date   Anal fissure    H. pylori infection    Childhood    Review of Systems:  Negative aside from that listed in individualized problem based charting.  Physical Exam:  Vitals:   10/21/22 1459  BP: 117/70  Pulse: 87  SpO2: 97%  Weight: 174 lb 11.2 oz (79.2 kg)  Height: 5' 4.96" (1.65 m)   Physical Exam Constitutional:      Appearance: He is well-developed. He is not ill-appearing.  HENT:     Mouth/Throat:     Mouth: Mucous membranes are moist.     Pharynx: Oropharynx is clear.  Eyes:     Pupils: Pupils are equal, round, and reactive to light.  Cardiovascular:     Rate and Rhythm: Normal rate and regular rhythm.     Heart sounds: Normal heart sounds. No murmur heard.    No friction rub. No gallop.  Pulmonary:     Effort: Pulmonary effort is normal.     Breath sounds: Normal breath sounds. No wheezing, rhonchi or rales.  Abdominal:     General: Abdomen is flat. Bowel sounds are normal. There is no distension.     Palpations: Abdomen is soft. There is no mass.     Tenderness: There is abdominal tenderness in the epigastric area. There is no guarding or rebound.  Skin:    General: Skin is warm and dry.  Neurological:     General: No focal deficit present.     Mental Status: He is alert and oriented to person, place, and time.  Psychiatric:        Mood and Affect: Mood normal.        Behavior: Behavior normal.      Assessment & Plan:   See Encounters Tab for problem based charting.  Patient discussed with Dr.  Saverio Danker

## 2022-10-27 ENCOUNTER — Encounter: Payer: Medicaid Other | Admitting: Student

## 2022-10-28 NOTE — Progress Notes (Signed)
Internal Medicine Clinic Attending  Case discussed with Dr. Allyson Sabal  At the time of the visit.  We reviewed the resident's history and exam and pertinent patient test results.  I agree with the assessment, diagnosis, and plan of care documented in the resident's note. HBsAg negative during last visit, however will plan to check HBsAb to assess immunity and need for immunization.

## 2022-11-09 ENCOUNTER — Ambulatory Visit (HOSPITAL_COMMUNITY)
Admission: RE | Admit: 2022-11-09 | Discharge: 2022-11-09 | Disposition: A | Discharge status: Home / Self Care | Payer: Medicaid Other | Source: Ambulatory Visit | Attending: Internal Medicine | Admitting: Internal Medicine

## 2022-11-09 ENCOUNTER — Ambulatory Visit (HOSPITAL_COMMUNITY): Payer: Medicaid Other

## 2022-11-09 DIAGNOSIS — R7989 Other specified abnormal findings of blood chemistry: Secondary | ICD-10-CM | POA: Insufficient documentation

## 2022-11-10 ENCOUNTER — Encounter (HOSPITAL_COMMUNITY): Payer: Self-pay

## 2022-11-10 ENCOUNTER — Ambulatory Visit (HOSPITAL_COMMUNITY)
Admission: EM | Admit: 2022-11-10 | Discharge: 2022-11-10 | Disposition: A | Discharge status: Home / Self Care | Payer: Medicaid Other | Attending: Emergency Medicine | Admitting: Emergency Medicine

## 2022-11-10 DIAGNOSIS — B349 Viral infection, unspecified: Secondary | ICD-10-CM | POA: Insufficient documentation

## 2022-11-10 DIAGNOSIS — Z1152 Encounter for screening for COVID-19: Secondary | ICD-10-CM | POA: Insufficient documentation

## 2022-11-10 LAB — RESP PANEL BY RT-PCR (FLU A&B, COVID) ARPGX2
Influenza A by PCR: NEGATIVE
Influenza B by PCR: NEGATIVE
SARS Coronavirus 2 by RT PCR: NEGATIVE
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
## 2022-11-10 NOTE — Discharge Instructions (Addendum)
I am treating you for a viral illness today.  Viral illnesses usually takes 7 to 10 days to resolve.  Using over-the-counter medications can help treat the symptoms. You can use Tylenol for fever and moderate pain, you can take this medication every 4-6 hours, please do not take more than 3000 mg in a 24-hour day.   Mucinex is an expectorant that can be purchased over-the-counter, this can help clear congestion from your respiratory tract.  For Mucinex dosage, please refer to the back of the box for dosage instructions, this can vary depending on the brand of medication.  Maintaining hydration status is very important, please drink at least 8 cups of water daily.  Please try to intake nutrient dense meals.

## 2022-11-10 NOTE — ED Triage Notes (Signed)
Pt states fever,body aches, congestion and cough for over a week.  Has  been taking Tylenol at home for the fever.

## 2022-11-20 ENCOUNTER — Other Ambulatory Visit: Payer: Self-pay

## 2022-11-20 ENCOUNTER — Emergency Department (HOSPITAL_COMMUNITY): Payer: Medicaid Other

## 2022-11-20 ENCOUNTER — Emergency Department (HOSPITAL_COMMUNITY)
Admission: EM | Admit: 2022-11-20 | Discharge: 2022-11-20 | Disposition: A | Discharge status: Home / Self Care | Payer: Medicaid Other | Attending: Emergency Medicine | Admitting: Emergency Medicine

## 2022-11-20 ENCOUNTER — Encounter (HOSPITAL_COMMUNITY): Payer: Self-pay

## 2022-11-20 DIAGNOSIS — R112 Nausea with vomiting, unspecified: Secondary | ICD-10-CM | POA: Diagnosis not present

## 2022-11-20 DIAGNOSIS — Z1152 Encounter for screening for COVID-19: Secondary | ICD-10-CM | POA: Diagnosis not present

## 2022-11-20 DIAGNOSIS — R197 Diarrhea, unspecified: Secondary | ICD-10-CM | POA: Diagnosis present

## 2022-11-20 LAB — URINALYSIS, ROUTINE W REFLEX MICROSCOPIC
Bilirubin Urine: NEGATIVE
Glucose, UA: NEGATIVE mg/dL
Hgb urine dipstick: NEGATIVE
Ketones, ur: NEGATIVE mg/dL
Leukocytes,Ua: NEGATIVE
Nitrite: NEGATIVE
Protein, ur: NEGATIVE mg/dL
Specific Gravity, Urine: 1.016 (ref 1.005–1.030)
pH: 5 (ref 5.0–8.0)

## 2022-11-20 LAB — COMPREHENSIVE METABOLIC PANEL
ALT: 49 U/L — ABNORMAL HIGH (ref 0–44)
AST: 26 U/L (ref 15–41)
Albumin: 4.3 g/dL (ref 3.5–5.0)
Alkaline Phosphatase: 134 U/L — ABNORMAL HIGH (ref 38–126)
Anion gap: 8 (ref 5–15)
BUN: 11 mg/dL (ref 6–20)
CO2: 26 mmol/L (ref 22–32)
Calcium: 9.3 mg/dL (ref 8.9–10.3)
Chloride: 103 mmol/L (ref 98–111)
Creatinine, Ser: 0.91 mg/dL (ref 0.61–1.24)
GFR, Estimated: >60 mL/min (ref >60)
Glucose, Bld: 113 mg/dL — ABNORMAL HIGH (ref 70–99)
Potassium: 4.4 mmol/L (ref 3.5–5.1)
Sodium: 137 mmol/L (ref 135–145)
Total Bilirubin: 0.3 mg/dL (ref 0.3–1.2)
Total Protein: 7 g/dL (ref 6.5–8.1)

## 2022-11-20 LAB — CBC
HCT: 42.1 % (ref 39.0–52.0)
Hemoglobin: 13.7 g/dL (ref 13.0–17.0)
MCH: 26.8 pg (ref 26.0–34.0)
MCHC: 32.5 g/dL (ref 30.0–36.0)
MCV: 82.4 fL (ref 80.0–100.0)
Platelets: 276 10*3/uL (ref 150–400)
RBC: 5.11 MIL/uL (ref 4.22–5.81)
RDW: 12.6 % (ref 11.5–15.5)
WBC: 8.9 10*3/uL (ref 4.0–10.5)
nRBC: 0 % (ref 0.0–0.2)

## 2022-11-20 LAB — RESP PANEL BY RT-PCR (FLU A&B, COVID) ARPGX2
Influenza A by PCR: NEGATIVE
Influenza B by PCR: NEGATIVE
SARS Coronavirus 2 by RT PCR: NEGATIVE

## 2022-11-20 LAB — LIPASE, BLOOD: Lipase: 24 U/L (ref 11–51)

## 2022-11-20 MED ORDER — LOPERAMIDE HCL 2 MG PO CAPS: 2.0000 mg | ORAL_CAPSULE | Freq: Four times a day (QID) | ORAL | 0 refills | Status: AC | PRN

## 2022-11-20 MED ORDER — ONDANSETRON 4 MG PO TBDP: 4.0000 mg | ORAL_TABLET | Freq: Three times a day (TID) | ORAL | 0 refills | Status: AC | PRN

## 2022-11-20 NOTE — Discharge Instructions (Addendum)
Follow-up with your primary doctor.  You will be notified if the cultures come back positive.  Also follow-up with gastroenterology as planned.

## 2022-11-20 NOTE — ED Provider Triage Note (Signed)
Emergency Medicine Provider Triage Evaluation Note  Domique Clapper , a 31 y.o. male  was evaluated in triage.  Pt complains of 3 weeks of chills, chest congestion, coughing, abd pain, vomiting, diarrhea. Thinks he had a fever yesterday. Was seen at [facility] and tested negative for COVID.   Thinks he has typhoid fever based on what he read on the internet. Has not traveled outside the [place] in 2 years.   Review of Systems  Positive: As above Negative:   Physical Exam  BP 105/65 (BP Location: Right Arm)   Pulse (!) 102   Temp 98.7 F (37.1 C) (Oral)   Resp 19   SpO2 95%  Gen:   Awake, no distress   Resp:  Normal effort  MSK:   Moves extremities without difficulty  Other:    Medical Decision Making  Medically screening exam initiated at 11:51 AM.  Appropriate orders placed.  [name] [employer] was informed that the remainder of the evaluation will be completed by another provider, this initial triage assessment does not replace that evaluation, and the importance of remaining in the ED until their evaluation is complete.    Jamee Keach T, PA-C 11/20/22 1153

## 2022-11-20 NOTE — ED Notes (Signed)
Patient transported to X-ray 

## 2022-11-20 NOTE — ED Notes (Signed)
Mirco agreed to add on urine culture

## 2022-11-20 NOTE — ED Notes (Signed)
Pt unable to give stool sampled EDP notified before pt was discharged

## 2022-11-20 NOTE — ED Triage Notes (Signed)
Pt arrived POV from home c/o abdominal pain and body aches, chills and multiple other complaints. Pt was seen at an urgent care and tested for flu and COVID and it was negative. Pt states he has had these symptoms for 3 weeks.

## 2022-11-21 LAB — URINE CULTURE: Culture: NO GROWTH

## 2022-11-21 NOTE — ED Provider Notes (Signed)
[facility] EMERGENCY DEPARTMENT Provider Note   CSN: [id number] Arrival date & time: 11/20/22  1114     History  Chief Complaint  Patient presents with   Abdominal Pain    Ryan Hayes is a 31 y.o. male.   Abdominal Pain  Patient presents abdominal pain.  Has been going for a while now.  Has had diarrhea.  Has had epigastric pain.  Now developed some diarrhea.  Has had fevers at times.  These have been going for months on and off.  However the diarrhea just started.  Has been seen by PCP.  Has had work-up without clear cause.  Has had ultrasound.  LFTs have been a little elevated at times.  Patient states he is worried about typhoid because he is from [place] and got here a couple years ago.   Past Medical History:  Diagnosis Date   Anal fissure    H. pylori infection    Childhood    Home Medications Prior to Admission medications   Medication Sig Start Date End Date Taking? Authorizing Provider  loperamide (IMODIUM) 2 MG capsule Take 1 capsule (2 mg total) by mouth 4 (four) times daily as needed for diarrhea or loose stools. 11/20/22  Yes Davonna Belling, MD  ondansetron (ZOFRAN-ODT) 4 MG disintegrating tablet Take 1 tablet (4 mg total) by mouth every 8 (eight) hours as needed for nausea or vomiting. 11/20/22  Yes Davonna Belling, MD  AMBULATORY NON FORMULARY MEDICATION Medication Name: Diltiazem 2% - Using your index finger, apply a small amount of medication inside the rectum up to your first knuckle/joint three times daily x 6-8 weeks. 05/18/22   Levin Erp, PA  EPINEPHrine 0.3 mg/0.3 mL IJ SOAJ injection Inject 0.3 mg into the muscle as needed for anaphylaxis. 08/09/22   Levie Heritage, MD  pantoprazole (PROTONIX) 40 MG tablet Take 1 tablet (40 mg total) by mouth 2 (two) times daily before a meal. 10/21/22 10/21/23  Virl Axe, MD  sucralfate (CARAFATE) 1 g tablet Take 1 tablet (1 g total) by mouth with breakfast, with lunch,  and with evening meal. 10/21/22   Virl Axe, MD      Allergies    Patient has no active allergies.    Review of Systems   Review of Systems  Gastrointestinal:  Positive for abdominal pain.    Physical Exam Updated Vital Signs BP 118/76   Pulse 85   Temp 98.5 F (36.9 C) (Oral)   Resp 18   SpO2 97%  Physical Exam Vitals and nursing note reviewed.  Cardiovascular:     Rate and Rhythm: Normal rate.  Abdominal:     Comments: Minimal abdominal tenderness no rebound or guarding.  No hernia palpated.  Skin:    General: Skin is warm.  Neurological:     Mental Status: He is alert and oriented to person, place, and time.     ED Results / Procedures / Treatments   Labs (all labs ordered are listed, but only abnormal results are displayed) Labs Reviewed  COMPREHENSIVE METABOLIC PANEL - Abnormal; Notable for the following components:      Result Value   Glucose, Bld 113 (*)    ALT 49 (*)    Alkaline Phosphatase 134 (*)    All other components within normal limits  RESP PANEL BY RT-PCR (FLU A&B, COVID) ARPGX2  CULTURE, BLOOD (ROUTINE X 2)  CULTURE, BLOOD (ROUTINE X 2)  GASTROINTESTINAL PANEL BY PCR, STOOL (REPLACES STOOL CULTURE)  URINE CULTURE  LIPASE, BLOOD  CBC  URINALYSIS, ROUTINE W REFLEX MICROSCOPIC    EKG None  Radiology DG Chest 2 View  Result Date: 11/20/2022 CLINICAL DATA:  Intermittent cough for 3 weeks. EXAM: CHEST - 2 VIEW COMPARISON:  April 14, 2022 FINDINGS: The heart size and mediastinal contours are within normal limits. Both lungs are clear. The visualized skeletal structures are unremarkable. IMPRESSION: No active cardiopulmonary disease. Electronically Signed   By: Abelardo Diesel M.D.   On: 11/20/2022 14:33    Procedures Procedures    Medications Ordered in ED Medications - No data to display  ED Course/ Medical Decision Making/ A&P                           Medical Decision Making Amount and/or Complexity of Data Reviewed Labs:  ordered. Radiology: ordered.  Risk Prescription drug management.   Patient with abdominal complaints.  States chills.  Cough at times.  Work-up reassuring.  Negative COVID and flu testing.  Reviewed notes from previous office visits.  Differential diagnosis includes infections obstructions anxiety. Discussed with ID about potential other causes such as typhoid.  Recommend getting blood cultures and urine cultures.  Stool culture was also ordered but did not have diarrhea while he was here.  Appears stable for discharge home.  Will treat symptomatically with outpatient follow-up with his clinic and GI as needed.        Final Clinical Impression(s) / ED Diagnoses Final diagnoses:  Nausea vomiting and diarrhea    Rx / DC Orders ED Discharge Orders          Ordered    ondansetron (ZOFRAN-ODT) 4 MG disintegrating tablet  Every 8 hours PRN        11/20/22 1604    loperamide (IMODIUM) 2 MG capsule  4 times daily PRN        11/20/22 1604              Davonna Belling, MD 11/21/22 1520

## 2022-11-24 ENCOUNTER — Ambulatory Visit: Payer: Medicaid Other | Admitting: Internal Medicine

## 2022-11-24 ENCOUNTER — Encounter: Payer: Self-pay | Admitting: Internal Medicine

## 2022-11-24 VITALS — BP 133/82 | HR 67 | Temp 98.7°F | Ht 67.0 in | Wt 170.4 lb

## 2022-11-24 DIAGNOSIS — R197 Diarrhea, unspecified: Secondary | ICD-10-CM | POA: Diagnosis present

## 2022-11-24 DIAGNOSIS — R1084 Generalized abdominal pain: Secondary | ICD-10-CM | POA: Diagnosis not present

## 2022-11-24 DIAGNOSIS — R109 Unspecified abdominal pain: Secondary | ICD-10-CM | POA: Insufficient documentation

## 2022-11-24 NOTE — Progress Notes (Signed)
CC: ED follow up  HPI:  Ryan Hayes is a 31 y.o. male living with a history stated below and presents today for an ED follow up after being seen for abd pain and diarrhea. Please see problem based assessment and plan for additional details.  Past Medical History:  Diagnosis Date   Anal fissure    H. pylori infection    Childhood    Current Outpatient Medications on File Prior to Visit  Medication Sig Dispense Refill   AMBULATORY NON FORMULARY MEDICATION Medication Name: Diltiazem 2% - Using your index finger, apply a small amount of medication inside the rectum up to your first knuckle/joint three times daily x 6-8 weeks. 30 g 1   EPINEPHrine 0.3 mg/0.3 mL IJ SOAJ injection Inject 0.3 mg into the muscle as needed for anaphylaxis. 1 each 0   loperamide (IMODIUM) 2 MG capsule Take 1 capsule (2 mg total) by mouth 4 (four) times daily as needed for diarrhea or loose stools. 12 capsule 0   ondansetron (ZOFRAN-ODT) 4 MG disintegrating tablet Take 1 tablet (4 mg total) by mouth every 8 (eight) hours as needed for nausea or vomiting. 8 tablet 0   pantoprazole (PROTONIX) 40 MG tablet Take 1 tablet (40 mg total) by mouth 2 (two) times daily before a meal. 60 tablet 2   sucralfate (CARAFATE) 1 g tablet Take 1 tablet (1 g total) by mouth with breakfast, with lunch, and with evening meal. 90 tablet 0   No current facility-administered medications on file prior to visit.    Family History  Problem Relation Age of Onset   Hypertension Mother    Colon cancer Neg Hx    Throat cancer Neg Hx    Stomach cancer Neg Hx    Rectal cancer Neg Hx     Social History   Socioeconomic History   Marital status: Married    Spouse name: Not on file   Number of children: 3   Years of education: Not on file   Highest education level: Not on file  Occupational History   Occupation: Accountant  Tobacco Use   Smoking status: Never   Smokeless tobacco: Never  Vaping Use   Vaping Use:  Never used  Substance and Sexual Activity   Alcohol use: Never   Drug use: Never   Sexual activity: Not on file  Other Topics Concern   Not on file  Social History Narrative   Not on file   Social Determinants of Health   Financial Resource Strain: Not on file  Food Insecurity: No Food Insecurity (11/24/2022)   Hunger Vital Sign    Worried About Running Out of Food in the Last Year: Never true    Ran Out of Food in the Last Year: Never true  Transportation Needs: No Transportation Needs (11/24/2022)   PRAPARE - Hydrologist (Medical): No    Lack of Transportation (Non-Medical): No  Physical Activity: Not on file  Stress: Not on file  Social Connections: Moderately Integrated (11/24/2022)   Social Connection and Isolation Panel [NHANES]    Frequency of Communication with Friends and Family: More than three times a week    Frequency of Social Gatherings with Friends and Family: More than three times a week    Attends Religious Services: More than 4 times per year    Active Member of [employer] or Organizations: No    Attends Archivist Meetings: Never    Marital Status:  Married  Intimate Partner Violence: Not At Risk (11/24/2022)   Humiliation, Afraid, Rape, and Kick questionnaire    Fear of Current or Ex-Partner: No    Emotionally Abused: No    Physically Abused: No    Sexually Abused: No    Review of Systems: ROS negative except for what is noted on the assessment and plan.  Vitals:   11/24/22 1338  BP: 133/82  Pulse: 67  Temp: 98.7 F (37.1 C)  TempSrc: Oral  SpO2: 98%  Weight: 170 lb 6.4 oz (77.3 kg)  Height: '5\' 7"'$  (1.702 m)    Physical Exam: Constitutional: well-appearing male sitting in chair, in no acute distress Cardiovascular: regular rate and rhythm, no m/r/g Pulmonary/Chest: normal work of breathing on room air, lungs clear to auscultation bilaterally Abdominal: soft, non-tender, slightly distended abdomen, normal  BS MSK: normal bulk and tone Neurological: alert & oriented x 3, no focal deficit Skin: warm and dry Psych: normal mood and behavior  Assessment & Plan:   Patient discussed with Dr. Jimmye Norman  Abdominal pain The patient presents to the [facility] to discuss abdominal pain that has been ongoing for the last 2 to 3 months.  He was seen in the ED for this pain a week ago, as he was also having new onset diarrhea.  The ED was unable to obtain stool studies, as the patient did not have any episodes of diarrhea while he was there.  CBC and CMP were generally unremarkable.  He previously had a right upper quadrant ultrasound due to elevated LFTs, and this was unremarkable as well.  The patient states that his abdominal pain and diarrhea have been limiting his ability to go to work, which has been distressing.  With the ongoing diarrhea,, the patient states that he has had little energy and has felt weak.  His appetite had been unchanged, but he notes that now he is afraid to eat due to his pain.  He has been taking Protonix, which has helped his abdominal pain and dyspepsia some, but it has not gone away.  Imodium has not helped his diarrhea.  The patient states that he is primarily concerned that with his diarrhea now, that he has typhoid.  He mentioned this concern in the emergency department and the ED provider discussed this with ID, who recommended blood cultures (which were negative).  Additionally, the patient is not having any fevers or rash and has been in the [place] for the last 2 years, thus making typhoid less likely.  The patient was referred to GI back in October, but has not heard from them regarding an appointment yet.  Plan: -Obtain stool studies (GI panel) -Provided patient with phone number for [facility] -Continue Protonix, Zofran, and Imodium as needed -Obtain CT abdomen and pelvis   Kayde Atkerson, D.O. [facility], PGY-2 Phone: [phone] Date 11/24/2022 Time  4:29 PM

## 2022-11-24 NOTE — Assessment & Plan Note (Signed)
The patient presents to the [facility] to discuss abdominal pain that has been ongoing for the last 2 to 3 months.  He was seen in the ED for this pain a week ago, as he was also having new onset diarrhea.  The ED was unable to obtain stool studies, as the patient did not have any episodes of diarrhea while he was there.  CBC and CMP were generally unremarkable.  He previously had a right upper quadrant ultrasound due to elevated LFTs, and this was unremarkable as well.  The patient states that his abdominal pain and diarrhea have been limiting his ability to go to work, which has been distressing.  With the ongoing diarrhea,, the patient states that he has had little energy and has felt weak.  His appetite had been unchanged, but he notes that now he is afraid to eat due to his pain.  He has been taking Protonix, which has helped his abdominal pain and dyspepsia some, but it has not gone away.  Imodium has not helped his diarrhea.  The patient states that he is primarily concerned that with his diarrhea now, that he has typhoid.  He mentioned this concern in the emergency department and the ED provider discussed this with ID, who recommended blood cultures (which were negative).  Additionally, the patient is not having any fevers or rash and has been in the [place] for the last 2 years, thus making typhoid less likely.  The patient was referred to GI back in October, but has not heard from them regarding an appointment yet.  Plan: -Obtain stool studies (GI panel) -Provided patient with phone number for [facility] -Continue Protonix, Zofran, and Imodium as needed -Obtain CT abdomen and pelvis

## 2022-11-24 NOTE — Patient Instructions (Signed)
Thank you, Mr.[name] Joson Sapp for allowing [place] to provide your care today. Today we discussed:  Please bring in a stool sample so we can run tests on it. We are also getting a CT scan of your abdomen to see if we can figure out what is causing your pain and diarrhea    Here is the phone number for the stomach doctors: [phone]    I have ordered the following labs for you:  Lab Orders  No laboratory test(s) ordered today     Tests ordered today:  CT abd/pelvis GI panel  Referrals ordered today:   Referral Orders  No referral(s) requested today     I have ordered the following medication/changed the following medications:   Stop the following medications: There are no discontinued medications.   Start the following medications: No orders of the defined types were placed in this encounter.    Follow up:  1 month     Should you have any questions or concerns please call the internal medicine clinic at [phone].     Buddy Duty, D.O. [place]

## 2022-11-25 LAB — CULTURE, BLOOD (ROUTINE X 2)
Culture: NO GROWTH
Culture: NO GROWTH
Special Requests: ADEQUATE
Special Requests: ADEQUATE

## 2022-11-29 ENCOUNTER — Other Ambulatory Visit: Payer: Self-pay | Admitting: Internal Medicine

## 2022-11-29 LAB — GI PROFILE, STOOL, PCR
Adenovirus F 40/41: NOT DETECTED
Astrovirus: NOT DETECTED
C difficile toxin A/B: NOT DETECTED
Campylobacter: NOT DETECTED
Cryptosporidium: NOT DETECTED
Cyclospora cayetanensis: NOT DETECTED
Entamoeba histolytica: NOT DETECTED
Enteroaggregative E coli: NOT DETECTED
Enteropathogenic E coli: NOT DETECTED
Enterotoxigenic E coli: NOT DETECTED
Giardia lamblia: DETECTED — AB
Norovirus GI/GII: NOT DETECTED
Plesiomonas shigelloides: NOT DETECTED
Rotavirus A: NOT DETECTED
Salmonella: NOT DETECTED
Sapovirus: NOT DETECTED
Shiga-toxin-producing E coli: NOT DETECTED
Shigella/Enteroinvasive E coli: NOT DETECTED
Vibrio cholerae: NOT DETECTED
Vibrio: NOT DETECTED
Yersinia enterocolitica: NOT DETECTED

## 2022-11-29 MED ORDER — TINIDAZOLE 500 MG PO TABS: 2.0000 g | ORAL_TABLET | Freq: Once | ORAL | 0 refills | Status: DC

## 2022-11-29 NOTE — Progress Notes (Signed)
Sent in rx for tinidazole 2 g as a single dose for giardiasis. The patient was called and made aware - he is still having some diarrhea, but this has actually improved some since he was seen in the clinic.

## 2022-11-30 NOTE — Progress Notes (Signed)
Internal Medicine Clinic Attending ° °Case discussed with Dr. Atway  At the time of the visit.  We reviewed the resident’s history and exam and pertinent patient test results.  I agree with the assessment, diagnosis, and plan of care documented in the resident’s note.  °

## 2022-12-01 MED ORDER — METRONIDAZOLE 500 MG PO TABS: 500.0000 mg | ORAL_TABLET | Freq: Two times a day (BID) | ORAL | 0 refills | Status: AC

## 2022-12-01 NOTE — Addendum Note (Signed)
Addended by: Buddy Duty on: 12/01/2022 04:36 PM   Modules accepted: Orders

## 2022-12-29 ENCOUNTER — Ambulatory Visit (HOSPITAL_COMMUNITY): Payer: Medicaid Other

## 2022-12-30 ENCOUNTER — Ambulatory Visit (INDEPENDENT_AMBULATORY_CARE_PROVIDER_SITE_OTHER): Payer: Medicaid Other | Admitting: Gastroenterology

## 2022-12-30 ENCOUNTER — Other Ambulatory Visit (INDEPENDENT_AMBULATORY_CARE_PROVIDER_SITE_OTHER): Payer: Medicaid Other

## 2022-12-30 ENCOUNTER — Telehealth: Payer: Self-pay | Admitting: Gastroenterology

## 2022-12-30 ENCOUNTER — Encounter: Payer: Self-pay | Admitting: Gastroenterology

## 2022-12-30 VITALS — BP 112/76 | HR 78 | Ht 67.0 in | Wt 168.0 lb

## 2022-12-30 DIAGNOSIS — K601 Chronic anal fissure: Secondary | ICD-10-CM

## 2022-12-30 DIAGNOSIS — A071 Giardiasis [lambliasis]: Secondary | ICD-10-CM | POA: Diagnosis not present

## 2022-12-30 DIAGNOSIS — R748 Abnormal levels of other serum enzymes: Secondary | ICD-10-CM

## 2022-12-30 LAB — HEPATIC FUNCTION PANEL
ALT: 40 U/L (ref 0–53)
AST: 24 U/L (ref 0–37)
Albumin: 4.8 g/dL (ref 3.5–5.2)
Alkaline Phosphatase: 178 U/L — ABNORMAL HIGH (ref 39–117)
Bilirubin, Direct: 0.1 mg/dL (ref 0.0–0.3)
Total Bilirubin: 0.4 mg/dL (ref 0.2–1.2)
Total Protein: 7.6 g/dL (ref 6.0–8.3)
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
## 2022-12-30 NOTE — Telephone Encounter (Signed)
The pt has been advised that the labs have not been reviewed as of now. We will call him when available.

## 2022-12-30 NOTE — Patient Instructions (Addendum)
  If you are age 32 or older, your body mass index should be between 23-30. Your Body mass index is 26.31 kg/m. If this is out of the aforementioned range listed, please consider follow up with your Primary Care Provider.  If you are age 9 or younger, your body mass index should be between 19-25. Your Body mass index is 26.31 kg/m. If this is out of the aformentioned range listed, please consider follow up with your Primary Care Provider.   Your provider has requested that you go to the basement level for lab work before leaving today. Press "B" on the elevator. The lab is located at the first door on the left as you exit the elevator.   The [facility] providers would like to encourage you to use [facility] to communicate with providers for non-urgent requests or questions.  Due to long hold times on the telephone, sending your provider a message by [facility] may be a faster and more efficient way to get a response.  Please allow 48 business hours for a response.  Please remember that this is for non-urgent requests.   It was a pleasure to see you today!  Thank you for trusting me with your gastrointestinal care!     Scott E.Candis Schatz, MD

## 2022-12-30 NOTE — Telephone Encounter (Signed)
PT is calling to get results from labs.

## 2023-01-03 ENCOUNTER — Other Ambulatory Visit: Payer: Self-pay

## 2023-01-03 DIAGNOSIS — R748 Abnormal levels of other serum enzymes: Secondary | ICD-10-CM

## 2023-01-03 NOTE — Progress Notes (Signed)
Fazeel,  Your liver enzymes were persistently elevated.  Specifically, your alkaline phosphatase, which is an enzyme that is found in the cells of the bile ducts.  We need to further evaluate your biliary tree with an MRI.  This will tell us if there is inflammation or obstruction of your bile ducts.   In addition, I want to check some more labs to look for conditions that can cause chronic liver disease.  Please return to our lab at your convenience for further lab testing.   We will place the order for the MRI and you will be contacted about scheduling.  [place],  Can you please order an MRCP and the following lab tests: Anti-smooth muscle antibody  IgG4 level  IgG level  IgA level anti-mitochondrial antibody Alpha-1 antitrypsin level Iron panel/ferritin Ceruloplasmin TTG

## 2023-01-17 ENCOUNTER — Ambulatory Visit (HOSPITAL_COMMUNITY): Payer: Medicaid Other

## 2023-01-19 ENCOUNTER — Ambulatory Visit (HOSPITAL_COMMUNITY)
Admission: RE | Admit: 2023-01-19 | Discharge: 2023-01-19 | Disposition: A | Discharge status: Home / Self Care | Payer: Medicaid Other | Source: Ambulatory Visit | Attending: Gastroenterology | Admitting: Gastroenterology

## 2023-01-19 ENCOUNTER — Other Ambulatory Visit: Payer: Self-pay | Admitting: Gastroenterology

## 2023-01-19 DIAGNOSIS — R748 Abnormal levels of other serum enzymes: Secondary | ICD-10-CM

## 2023-01-19 MED ORDER — GADOBUTROL 1 MMOL/ML IV SOLN
7.0000 mL | Freq: Once | INTRAVENOUS | Status: AC | PRN
  Administered 2023-01-19: 7 mL via INTRAVENOUS

## 2023-01-20 NOTE — Progress Notes (Signed)
Ryan Hayes,  The MRI of your abdomen showed a normal appearing biliary tree.  There was no evidence of bile duct dilation or inflammation.  Your liver appeared normal. The radiologist commented on a somewhat atypical appearance of your pancreas.  Although this does not mean anything by itself, the appearance of the pancreas can be seen in autoimmune pancreatitis.  Your lipase level was recently checked and was normal.  I think it is unlikely that you have autoimmune pancreatitis. One of the labs associated with autoimmune pancreatitis has already been ordered.  Please come by our lab at your convenience to have his blood tests drawn, as well as the other tests drawn to evaluate for chronic liver disease.

## 2023-02-10 IMAGING — CR DG CHEST 1V
1 series · 1 of 1 positions shown · non-contrast
Comparison: None.

CLINICAL DATA: Positive quantitative tuberculosis test.

EXAM:
CHEST  1 VIEW

[w chest pa]
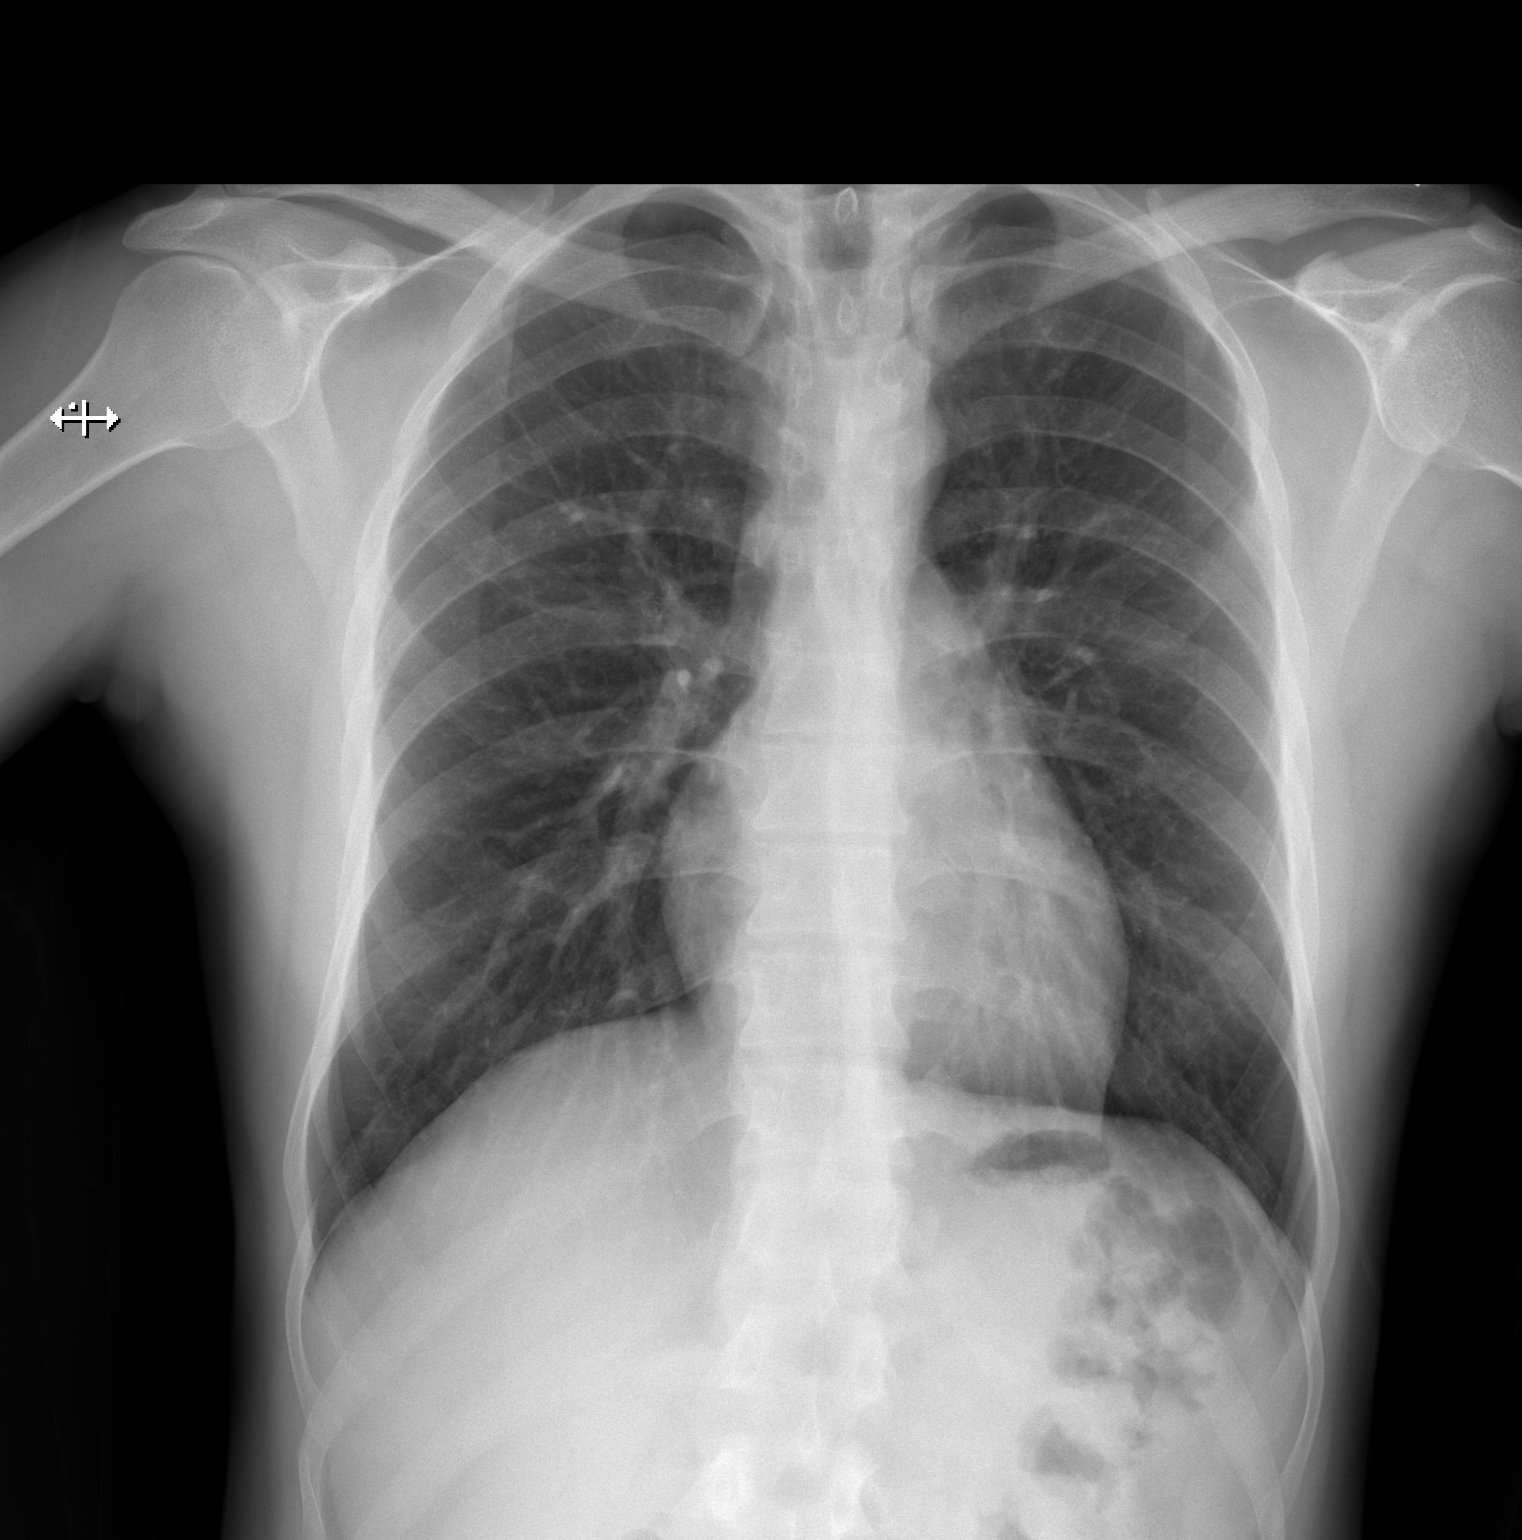

[1 of 1 positions shown; findings below may reference images not displayed]

FINDINGS: The heart size and mediastinal contours are within normal limits.

No focal consolidation. No pulmonary edema. No pleural effusion. No
pneumothorax.

No acute osseous abnormality.
IMPRESSION: No active disease.

## 2023-06-15 ENCOUNTER — Ambulatory Visit (INDEPENDENT_AMBULATORY_CARE_PROVIDER_SITE_OTHER): Payer: Medicaid Other | Admitting: Licensed Clinical Social Worker

## 2023-06-15 ENCOUNTER — Ambulatory Visit (INDEPENDENT_AMBULATORY_CARE_PROVIDER_SITE_OTHER): Payer: Medicaid Other | Admitting: Internal Medicine

## 2023-06-15 ENCOUNTER — Telehealth: Payer: Self-pay | Admitting: *Deleted

## 2023-06-15 DIAGNOSIS — F411 Generalized anxiety disorder: Secondary | ICD-10-CM

## 2023-06-15 MED ORDER — HYDROXYZINE HCL 25 MG PO TABS: 25.0000 mg | ORAL_TABLET | Freq: Every evening | ORAL | 0 refills | Status: DC | PRN

## 2023-06-15 MED ORDER — FLUOXETINE HCL 20 MG PO CAPS: 20.0000 mg | ORAL_CAPSULE | Freq: Every day | ORAL | 2 refills | Status: DC

## 2023-06-15 NOTE — Telephone Encounter (Signed)
Call from patient requesting something for his Anxiety.  States feels like something is weird going on in his head.  Has been under a lot of stress lately .  He and his boss spoke yesterday and that was upsetting.  Cannot understand why he is going through this stress.  Has been on Fluoxetine before.  Took an expired pill earlier did not help.  Patient was previously given an appointment for 06/22/2023.  Would like to get something called in today to help him get though things until his appointment.  When asked if he has been talking with his family.  [place] Spaniel that his wife told him that if the job is to stressful to just go ahead and quit.  Has been thinking about his options.  Message to B. Ashe Counselor to see if she will be able to speak with patient.  Appointment made with B. Ashe to speak with patient today.  Patient was informed but still requests some medication for his Anxiety today.  Informed patient that message would be sent to his doctor and to go ahead and talk with Christen Butter the Counselor as well.  If medication is called in [facility] to [facility] [address].

## 2023-06-15 NOTE — Progress Notes (Signed)
   July 10 at 1:30 pm F/u in 2 weeks by tele health

## 2023-06-15 NOTE — BH Specialist Note (Signed)
[facility] spoke with patient and introduced self. Patient denied self harm or others. Patient advised he was at work and unable to talk.  Patient works 9-6pm daily with a lunch break at 1:30 Pm  Patient agreed to speak with [facility] on July 10th at 1:30pm   Christen Butter, MSW, LCSW-A She/Her Behavioral Health Clinician [facility]  Internal Medicine Center Direct [phone]  Fax [phone] Main Office Phone: [phone] [address] [place] [place], [place] [address] Website: [facility]  [place], [place]  [place]

## 2023-06-16 NOTE — Assessment & Plan Note (Signed)
Returned call to patient as he expressed increased anxiety for the last several week. He is having stress about work and financial problems. This is leading to difficulty sleeping. He does not have much time to talk currently as is at work and would prefer to come in person sometime soon. He is interested in restarting fluoxetine but does not want to be on this medication long term. P: Start fluoxetine 20 mg Trial hydroxyzine 25 mg at bedtime F/u in 2 weeks Appointment with Christen Butter set for July 10th.

## 2023-06-20 NOTE — Progress Notes (Signed)
Internal Medicine Clinic Attending  Case discussed with Dr. Masters  At the time of the visit.  We reviewed the resident's history and exam and pertinent patient test results.  I agree with the assessment, diagnosis, and plan of care documented in the resident's note.  

## 2023-06-20 NOTE — Addendum Note (Signed)
Addended by: Erlinda Hong T on: 06/20/2023 02:39 PM   Modules accepted: Level of Service

## 2023-06-22 ENCOUNTER — Encounter: Payer: Medicaid Other | Admitting: Student

## 2023-06-28 ENCOUNTER — Other Ambulatory Visit: Payer: Self-pay | Admitting: Student

## 2023-06-28 ENCOUNTER — Ambulatory Visit: Payer: Medicaid Other | Admitting: Student

## 2023-06-28 VITALS — BP 117/68 | HR 81 | Temp 98.0°F | Wt 172.5 lb

## 2023-06-28 DIAGNOSIS — R7989 Other specified abnormal findings of blood chemistry: Secondary | ICD-10-CM

## 2023-06-28 DIAGNOSIS — F411 Generalized anxiety disorder: Secondary | ICD-10-CM | POA: Diagnosis not present

## 2023-06-28 DIAGNOSIS — R748 Abnormal levels of other serum enzymes: Secondary | ICD-10-CM

## 2023-06-28 NOTE — Progress Notes (Signed)
CC: Uncontrolled anxiety   HPI:  Ryan Hayes is a 32 y.o. male living with a history stated below and presents today for concern with uncontrolled anxiety . Please see problem based assessment and plan for additional details.  Past Medical History:  Diagnosis Date   Anal fissure    H. pylori infection    Childhood    Current Outpatient Medications on File Prior to Visit  Medication Sig Dispense Refill   AMBULATORY NON FORMULARY MEDICATION Medication Name: Diltiazem 2% - Using your index finger, apply a small amount of medication inside the rectum up to your first knuckle/joint three times daily x 6-8 weeks. (Patient not taking: Reported on 12/30/2022) 30 g 1   EPINEPHrine 0.3 mg/0.3 mL IJ SOAJ injection Inject 0.3 mg into the muscle as needed for anaphylaxis. (Patient not taking: Reported on 12/30/2022) 1 each 0   FLUoxetine (PROZAC) 20 MG capsule Take 1 capsule (20 mg total) by mouth daily. 30 capsule 2   hydrOXYzine (ATARAX) 25 MG tablet Take 1 tablet (25 mg total) by mouth at bedtime as needed. 30 tablet 0   loperamide (IMODIUM) 2 MG capsule Take 1 capsule (2 mg total) by mouth 4 (four) times daily as needed for diarrhea or loose stools. (Patient not taking: Reported on 12/30/2022) 12 capsule 0   ondansetron (ZOFRAN-ODT) 4 MG disintegrating tablet Take 1 tablet (4 mg total) by mouth every 8 (eight) hours as needed for nausea or vomiting. (Patient not taking: Reported on 12/30/2022) 8 tablet 0   pantoprazole (PROTONIX) 40 MG tablet Take 1 tablet (40 mg total) by mouth 2 (two) times daily before a meal. 60 tablet 2   sucralfate (CARAFATE) 1 g tablet Take 1 tablet (1 g total) by mouth with breakfast, with lunch, and with evening meal. (Patient not taking: Reported on 12/30/2022) 90 tablet 0   No current facility-administered medications on file prior to visit.    Family History  Problem Relation Age of Onset   Hypertension Mother    Colon cancer Neg Hx    Throat cancer  Neg Hx    Stomach cancer Neg Hx    Rectal cancer Neg Hx     Social History   Socioeconomic History   Marital status: Married    Spouse name: Not on file   Number of children: 3   Years of education: Not on file   Highest education level: Not on file  Occupational History   Occupation: Accountant  Tobacco Use   Smoking status: Never   Smokeless tobacco: Never  Vaping Use   Vaping Use: Never used  Substance and Sexual Activity   Alcohol use: Never   Drug use: Never   Sexual activity: Not on file  Other Topics Concern   Not on file  Social History Narrative   Not on file   Social Determinants of Health   Financial Resource Strain: Not on file  Food Insecurity: No Food Insecurity (11/24/2022)   Hunger Vital Sign    Worried About Running Out of Food in the Last Year: Never true    Ran Out of Food in the Last Year: Never true  Transportation Needs: No Transportation Needs (11/24/2022)   PRAPARE - Administrator, Civil Service (Medical): No    Lack of Transportation (Non-Medical): No  Physical Activity: Not on file  Stress: Not on file  Social Connections: Moderately Integrated (11/24/2022)   Social Connection and Isolation Panel [NHANES]    Frequency of  Communication with Friends and Family: More than three times a week    Frequency of Social Gatherings with Friends and Family: More than three times a week    Attends Religious Services: More than 4 times per year    Active Member of [employer] or Organizations: No    Attends Banker Meetings: Never    Marital Status: Married  Catering manager Violence: Not At Risk (11/24/2022)   Humiliation, Afraid, Rape, and Kick questionnaire    Fear of Current or Ex-Partner: No    Emotionally Abused: No    Physically Abused: No    Sexually Abused: No    Review of Systems: ROS negative except for what is noted on the assessment and plan.  Vitals:   06/28/23 0901  BP: 117/68  Pulse: 81  Temp: 98 F (36.7 C)   TempSrc: Oral  SpO2: 98%  Weight: 172 lb 8 oz (78.2 kg)    Physical Exam: Constitutional: Mildly anxious  Psych: Bulky speech but goal oriented.  Assessment & Plan:   GAD (generalized anxiety disorder) Patient presented for a telephone visit follow up to discuss his uncontrolled anxiety, says he is been very stressed lately due to financial problems. Says " I tend to worry so much about regular things normal human being go through". Worries excessively about family, health, work,stocks and if he can provide for his family. Patient was prescribed Prozac but he didn't take it because he is concerned about weight gain side effects that he is had in the past from the same medication.The benefits of Prozac was however discussed with him and he aggress to give it a try again. Plan - Continue fluoxetine 20 mg - Continue hydroxyzine 25 mg as needed  -  follow up in 2 weeks  - Follow up with Marena Chancy tomorrow to discuss potential coping mechanisms for his anxiety   Elevated alkaline phosphatase level Had unexplained abdominal pains , had an extensive work up including imaging, all labs and imaging studies were unremarkable expect an elevated alk phosphatase of  178. Will repeat  CMP today to further assess.   Patient seen with Dr. Bayard Beaver, M.D [facility] Phone: [phone] Date 06/28/2023 Time 2:00 PM

## 2023-06-28 NOTE — Assessment & Plan Note (Addendum)
Patient presented for a telephone visit follow up to discuss his uncontrolled anxiety, says he is been very stressed lately due to financial problems. Says " I tend to worry so much about regular things normal human being go through". Worries excessively about family, health, work,stocks and if he can provide for his family. Patient was prescribed Prozac but he didn't take it because he is concerned about weight gain side effects that he is had in the past from the same medication.The benefits of Prozac was however discussed with him and he aggress to give it a try again. Plan - Continue fluoxetine 20 mg - Continue hydroxyzine 25 mg as needed  -  follow up in 2 weeks  - Follow up with Marena Chancy tomorrow to discuss potential coping mechanisms for his anxiety

## 2023-06-28 NOTE — Patient Instructions (Signed)
Thank you, [name] for allowing [place] to provide your care today. Today we discussed your anxiety and how its affecting your quality of life. - Continue taking the fluoxitine and Hydroxyzine as prescribes - We will do some blood work today to check how your liver is doing    - Follow up with [name] tomorrow as scheduled   I have ordered the following labs for you:  Lab Orders         CMP14 + Anion Gap        Referrals ordered today:   Referral Orders  No referral(s) requested today     I have ordered the following medication/changed the following medications:   Stop the following medications: There are no discontinued medications.   Start the following medications: No orders of the defined types were placed in this encounter.    Follow up: 2 months   Remember:   Should you have any questions or concerns please call the internal medicine clinic at [phone].    Ryan Hayes, M.D [facility]

## 2023-06-28 NOTE — Assessment & Plan Note (Addendum)
Had unexplained abdominal pains , had an extensive work up including imaging, all labs and imaging studies were unremarkable expect an elevated alk phosphatase of  178. Will repeat  CMP today to further assess.

## 2023-06-29 ENCOUNTER — Ambulatory Visit: Payer: Medicaid Other | Admitting: Licensed Clinical Social Worker

## 2023-06-30 ENCOUNTER — Telehealth: Payer: Self-pay

## 2023-06-30 LAB — CMP14 + ANION GAP
ALT: 34 IU/L (ref 0–44)
AST: 19 IU/L (ref 0–40)
Albumin: 4.8 g/dL (ref 4.1–5.1)
Alkaline Phosphatase: 196 IU/L — ABNORMAL HIGH (ref 44–121)
Anion Gap: 16 mmol/L (ref 10.0–18.0)
BUN/Creatinine Ratio: 17 (ref 9–20)
BUN: 14 mg/dL (ref 6–20)
Bilirubin Total: 0.2 mg/dL (ref 0.0–1.2)
CO2: 23 mmol/L (ref 20–29)
Calcium: 9.8 mg/dL (ref 8.7–10.2)
Chloride: 102 mmol/L (ref 96–106)
Creatinine, Ser: 0.83 mg/dL (ref 0.76–1.27)
Globulin, Total: 2.3 g/dL (ref 1.5–4.5)
Glucose: 124 mg/dL — ABNORMAL HIGH (ref 70–99)
Potassium: 4.3 mmol/L (ref 3.5–5.2)
Sodium: 141 mmol/L (ref 134–144)
Total Protein: 7.1 g/dL (ref 6.0–8.5)
eGFR: 120 mL/min/{1.73_m2} (ref >59)

## 2023-06-30 NOTE — Telephone Encounter (Signed)
Requesting lab results, please call pt back.  

## 2023-07-01 NOTE — Telephone Encounter (Signed)
Patients called, lab results explained and all questions answered. Patient is grateful for the call back1

## 2023-07-01 NOTE — Progress Notes (Signed)
Internal Medicine Clinic Attending  I was physically present during the key portions of the resident provided service and participated in the medical decision making of patient's management care. I reviewed pertinent patient test results.  The assessment, diagnosis, and plan were formulated together and I agree with the documentation in the resident's note.  Ayjah Show, MD  

## 2023-07-07 ENCOUNTER — Other Ambulatory Visit: Payer: Self-pay | Admitting: Internal Medicine

## 2023-07-07 DIAGNOSIS — F411 Generalized anxiety disorder: Secondary | ICD-10-CM

## 2023-08-02 ENCOUNTER — Telehealth: Payer: Self-pay

## 2023-08-02 NOTE — Telephone Encounter (Signed)
Questions about taking fluoxetine and one day vitamin together. Please call pt back.

## 2023-09-16 ENCOUNTER — Other Ambulatory Visit: Payer: Self-pay | Admitting: Student

## 2023-09-16 ENCOUNTER — Telehealth: Payer: Self-pay

## 2023-09-16 DIAGNOSIS — H539 Unspecified visual disturbance: Secondary | ICD-10-CM

## 2023-09-16 NOTE — Telephone Encounter (Signed)
Patient called he is requesting a referral to a eye doctor.

## 2024-01-31 IMAGING — CR DG CHEST 1V
1 series · 1 of 1 positions shown · non-contrast
Comparison: Chest radiograph 04/24/2021

CLINICAL DATA: Ppd exam.

EXAM:
CHEST  1 VIEW

[w chest pa]
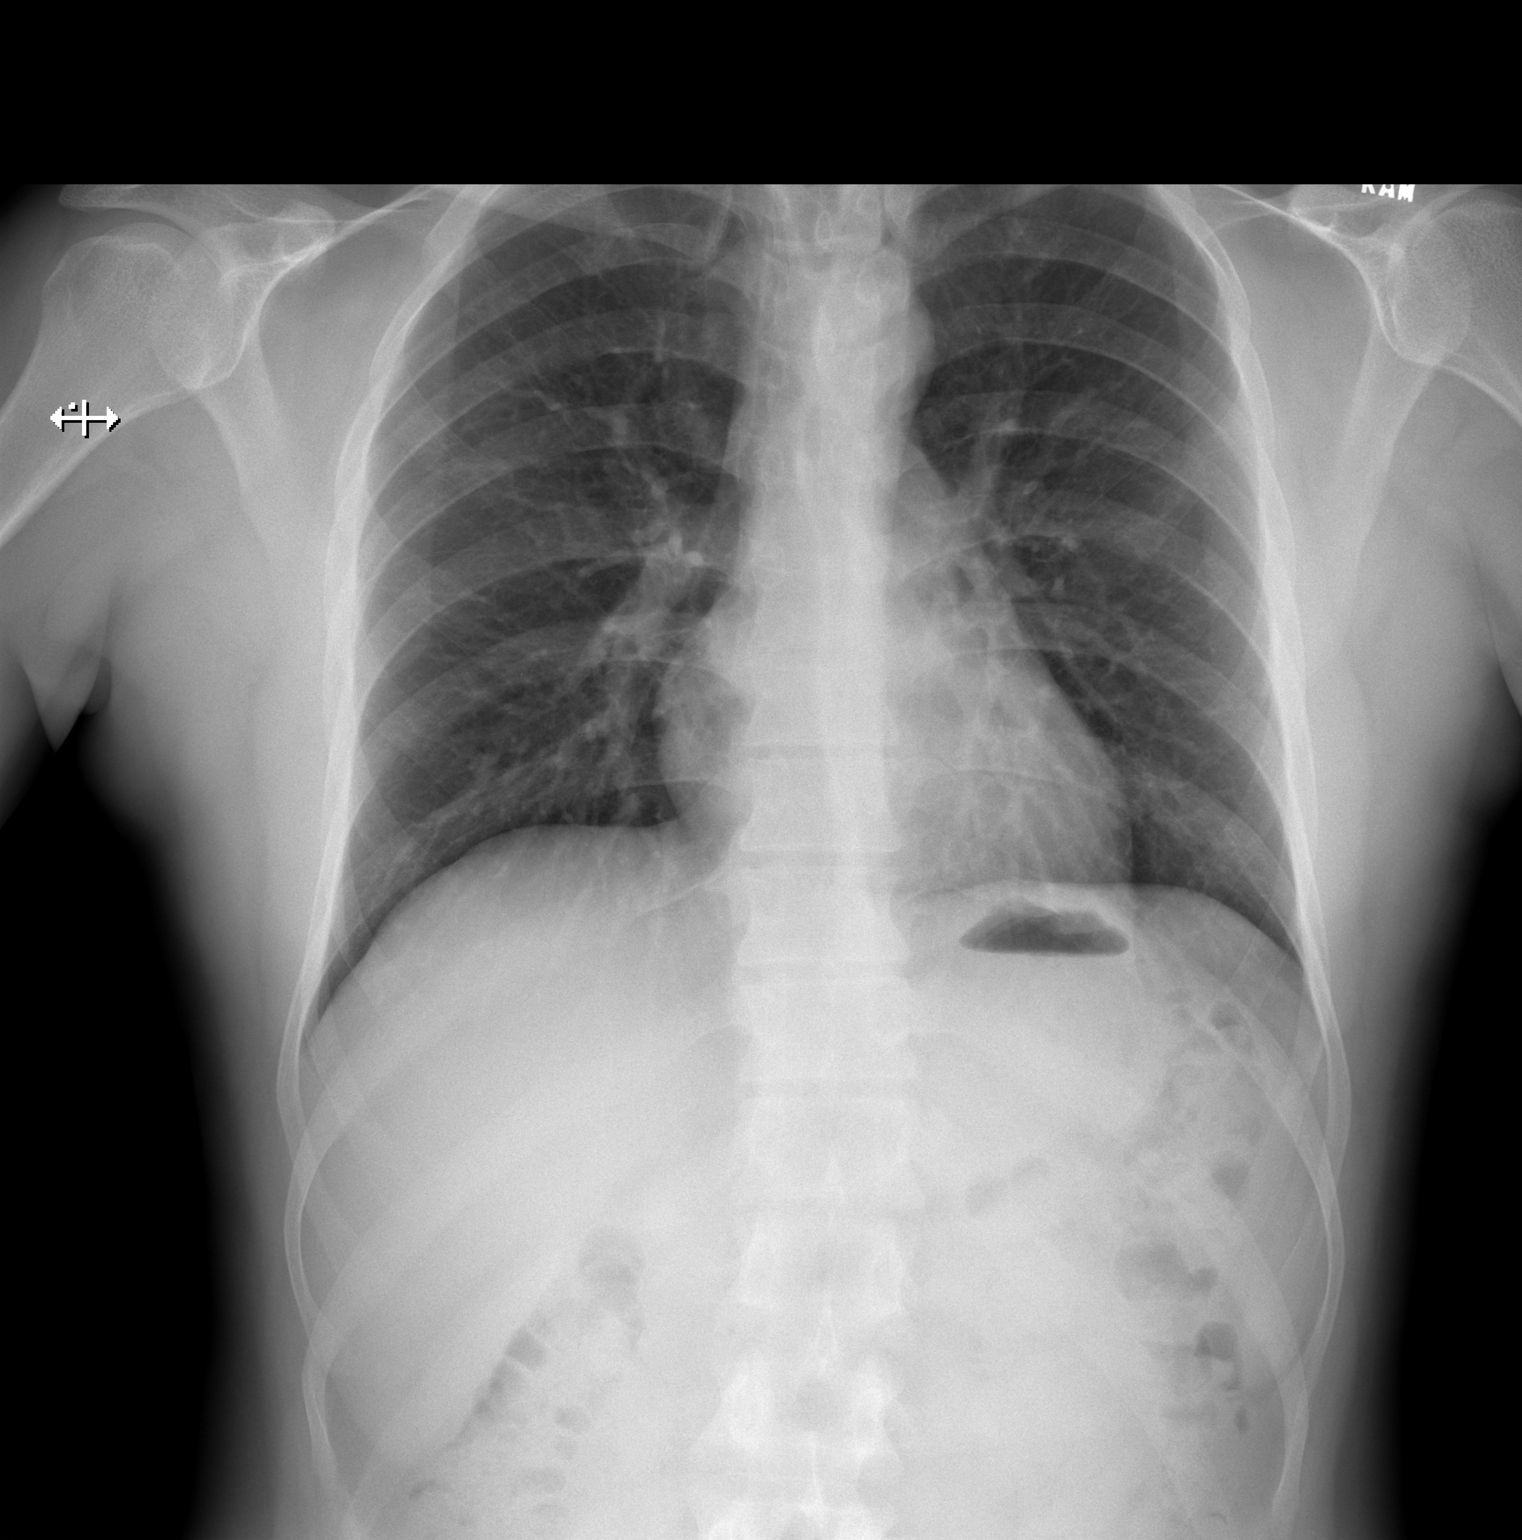

[1 of 1 positions shown; findings below may reference images not displayed]

FINDINGS: The heart size and mediastinal contours are within normal limits.
Both lungs are clear. The visualized skeletal structures are
unremarkable.
IMPRESSION: No active disease.

## 2024-02-08 ENCOUNTER — Encounter (HOSPITAL_COMMUNITY): Payer: Self-pay | Admitting: *Deleted

## 2024-02-08 ENCOUNTER — Ambulatory Visit (HOSPITAL_COMMUNITY)
Admission: EM | Admit: 2024-02-08 | Discharge: 2024-02-08 | Disposition: A | Discharge status: Home / Self Care | Payer: Medicaid Other | Attending: Sports Medicine | Admitting: Sports Medicine

## 2024-02-08 ENCOUNTER — Other Ambulatory Visit: Payer: Self-pay

## 2024-02-08 DIAGNOSIS — F524 Premature ejaculation: Secondary | ICD-10-CM

## 2024-02-08 DIAGNOSIS — B9789 Other viral agents as the cause of diseases classified elsewhere: Secondary | ICD-10-CM

## 2024-02-08 DIAGNOSIS — J988 Other specified respiratory disorders: Secondary | ICD-10-CM

## 2024-02-08 LAB — POC COVID19/FLU A&B COMBO
Covid Antigen, POC: NEGATIVE
Influenza A Antigen, POC: NEGATIVE
Influenza B Antigen, POC: NEGATIVE

## 2024-02-08 NOTE — Discharge Instructions (Addendum)
You have an upper respiratory infection. Most cases are due to a virus and do not require antibiotics for treatment.  Make sure to continue good oral hydration.  Rest as much as you can to help your body fight the infection. You can take tylenol or ibuprofen for fever/pain as needed Start Flonase daily for the next week and then as needed. You can use nasal saline spray multiple times daily as well.  You can use a daily antihistamine or guaifenesin as an expectorant but you need to be well hydrated for these medications to work.  Get adequate rest for recovery Maintain distance from others and wear a mask in public areas to avoid spread  If you start to experience shortness of breath, fevers that don't respond to medication, confusion, profound neck stiffness, or fainting, return to the urgent care or ED  I recommend you follow-up with your primary care physician to discuss premature ejaculation concerns.
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
## 2024-10-17 ENCOUNTER — Telehealth: Payer: Self-pay | Admitting: *Deleted

## 2024-10-17 NOTE — Telephone Encounter (Signed)
 Pt last seen by [facility] on 06/28/2023 CMA contacted pt to make an appt  Pt informs CMA that he has another pcp.  CMA informed pt that [facility] will update No further action needed, phone call complete.
